# Patient Record
Sex: Male | Born: 1955 | Hispanic: Yes | Marital: Married | Smoking: Former smoker
Health system: Southern US, Community
[De-identification: ages and names within clinical notes are randomized; demographics above are authoritative.]

## PROBLEM LIST (undated history)

## (undated) DIAGNOSIS — I10 Essential (primary) hypertension: Secondary | ICD-10-CM

## (undated) DIAGNOSIS — F1021 Alcohol dependence, in remission: Secondary | ICD-10-CM

## (undated) DIAGNOSIS — N529 Male erectile dysfunction, unspecified: Secondary | ICD-10-CM

## (undated) DIAGNOSIS — E039 Hypothyroidism, unspecified: Secondary | ICD-10-CM

## (undated) DIAGNOSIS — E079 Disorder of thyroid, unspecified: Secondary | ICD-10-CM

## (undated) DIAGNOSIS — N400 Enlarged prostate without lower urinary tract symptoms: Secondary | ICD-10-CM

## (undated) DIAGNOSIS — E119 Type 2 diabetes mellitus without complications: Secondary | ICD-10-CM

## (undated) DIAGNOSIS — E785 Hyperlipidemia, unspecified: Secondary | ICD-10-CM

## (undated) DIAGNOSIS — K219 Gastro-esophageal reflux disease without esophagitis: Secondary | ICD-10-CM

## (undated) HISTORY — DX: Disorder of thyroid, unspecified: E07.9

## (undated) HISTORY — DX: Gastro-esophageal reflux disease without esophagitis: K21.9

## (undated) HISTORY — PX: ABDOMINAL SURGERY: SHX537

## (undated) HISTORY — DX: Type 2 diabetes mellitus without complications: E11.9

## (undated) HISTORY — DX: Hyperlipidemia, unspecified: E78.5

## (undated) HISTORY — DX: Essential (primary) hypertension: I10

## (undated) HISTORY — DX: Benign prostatic hyperplasia without lower urinary tract symptoms: N40.0

---

## 1996-03-23 ENCOUNTER — Inpatient Hospital Stay: Admission: TF | Admit: 1996-03-23 | Payer: Self-pay

## 1996-04-09 ENCOUNTER — Ambulatory Visit: Payer: Self-pay

## 2015-10-22 DIAGNOSIS — E119 Type 2 diabetes mellitus without complications: Secondary | ICD-10-CM | POA: Insufficient documentation

## 2015-10-22 DIAGNOSIS — I10 Essential (primary) hypertension: Secondary | ICD-10-CM | POA: Insufficient documentation

## 2016-01-07 ENCOUNTER — Other Ambulatory Visit: Payer: Self-pay | Admitting: Family Medicine

## 2016-01-07 DIAGNOSIS — R29818 Other symptoms and signs involving the nervous system: Secondary | ICD-10-CM

## 2016-01-07 DIAGNOSIS — H532 Diplopia: Secondary | ICD-10-CM

## 2016-01-08 ENCOUNTER — Ambulatory Visit
Admission: RE | Admit: 2016-01-08 | Discharge: 2016-01-08 | Disposition: A | Discharge status: Home / Self Care | Payer: BLUE CROSS/BLUE SHIELD | Source: Ambulatory Visit | Attending: Family Medicine | Admitting: Family Medicine

## 2016-01-08 DIAGNOSIS — M2548 Effusion, other site: Secondary | ICD-10-CM | POA: Diagnosis not present

## 2016-01-08 DIAGNOSIS — H532 Diplopia: Secondary | ICD-10-CM

## 2016-01-08 DIAGNOSIS — R29818 Other symptoms and signs involving the nervous system: Secondary | ICD-10-CM | POA: Diagnosis present

## 2016-01-08 DIAGNOSIS — J3489 Other specified disorders of nose and nasal sinuses: Secondary | ICD-10-CM | POA: Diagnosis not present

## 2016-01-08 LAB — POCT I-STAT CREATININE: Creatinine, Ser: 1 mg/dL (ref 0.61–1.24)

## 2016-01-08 MED ORDER — GADOBENATE DIMEGLUMINE 529 MG/ML IV SOLN
15.0000 mL | Freq: Once | INTRAVENOUS | Status: AC | PRN
  Administered 2016-01-08: 14 mL via INTRAVENOUS

## 2017-01-04 DIAGNOSIS — E782 Mixed hyperlipidemia: Secondary | ICD-10-CM | POA: Insufficient documentation

## 2017-07-14 ENCOUNTER — Emergency Department: Payer: BLUE CROSS/BLUE SHIELD

## 2017-07-14 ENCOUNTER — Encounter: Payer: Self-pay | Admitting: Emergency Medicine

## 2017-07-14 ENCOUNTER — Emergency Department
Admission: EM | Admit: 2017-07-14 | Discharge: 2017-07-14 | Disposition: A | Discharge status: Home / Self Care | Payer: BLUE CROSS/BLUE SHIELD | Attending: Emergency Medicine | Admitting: Emergency Medicine

## 2017-07-14 DIAGNOSIS — Z7984 Long term (current) use of oral hypoglycemic drugs: Secondary | ICD-10-CM | POA: Diagnosis not present

## 2017-07-14 DIAGNOSIS — R1011 Right upper quadrant pain: Secondary | ICD-10-CM | POA: Diagnosis present

## 2017-07-14 DIAGNOSIS — E119 Type 2 diabetes mellitus without complications: Secondary | ICD-10-CM | POA: Diagnosis not present

## 2017-07-14 DIAGNOSIS — Z79899 Other long term (current) drug therapy: Secondary | ICD-10-CM | POA: Insufficient documentation

## 2017-07-14 DIAGNOSIS — K8050 Calculus of bile duct without cholangitis or cholecystitis without obstruction: Secondary | ICD-10-CM

## 2017-07-14 LAB — URINALYSIS, COMPLETE (UACMP) WITH MICROSCOPIC
BACTERIA UA: NONE SEEN
BILIRUBIN URINE: NEGATIVE
Glucose, UA: >=500 mg/dL — AB
Hgb urine dipstick: NEGATIVE
Ketones, ur: 20 mg/dL — AB
LEUKOCYTES UA: NEGATIVE
NITRITE: NEGATIVE
PH: 6 (ref 5.0–8.0)
Protein, ur: NEGATIVE mg/dL
SPECIFIC GRAVITY, URINE: 1.032 — AB (ref 1.005–1.030)

## 2017-07-14 LAB — CBC
HEMATOCRIT: 42.9 % (ref 40.0–52.0)
Hemoglobin: 14.5 g/dL (ref 13.0–18.0)
MCH: 30.6 pg (ref 26.0–34.0)
MCHC: 33.7 g/dL (ref 32.0–36.0)
MCV: 90.7 fL (ref 80.0–100.0)
PLATELETS: 211 10*3/uL (ref 150–440)
RBC: 4.74 MIL/uL (ref 4.40–5.90)
RDW: 13.7 % (ref 11.5–14.5)
WBC: 10.3 10*3/uL (ref 3.8–10.6)

## 2017-07-14 LAB — COMPREHENSIVE METABOLIC PANEL
ALBUMIN: 4.6 g/dL (ref 3.5–5.0)
ALT: 29 U/L (ref 17–63)
AST: 28 U/L (ref 15–41)
Alkaline Phosphatase: 101 U/L (ref 38–126)
Anion gap: 15 (ref 5–15)
BILIRUBIN TOTAL: 1 mg/dL (ref 0.3–1.2)
BUN: 25 mg/dL — AB (ref 6–20)
CO2: 27 mmol/L (ref 22–32)
CREATININE: 1.09 mg/dL (ref 0.61–1.24)
Calcium: 9.9 mg/dL (ref 8.9–10.3)
Chloride: 98 mmol/L — ABNORMAL LOW (ref 101–111)
GFR calc Af Amer: >60 mL/min (ref >60)
GLUCOSE: 237 mg/dL — AB (ref 65–99)
Potassium: 4.3 mmol/L (ref 3.5–5.1)
Sodium: 140 mmol/L (ref 135–145)
TOTAL PROTEIN: 8.2 g/dL — AB (ref 6.5–8.1)

## 2017-07-14 LAB — LIPASE, BLOOD: Lipase: 19 U/L (ref 11–51)

## 2017-07-14 LAB — TROPONIN I

## 2017-07-14 MED ORDER — ONDANSETRON 4 MG PO TBDP: 4.0000 mg | ORAL_TABLET | Freq: Three times a day (TID) | ORAL | 0 refills | Status: DC | PRN

## 2017-07-14 MED ORDER — MORPHINE SULFATE (PF) 4 MG/ML IV SOLN
INTRAVENOUS | Status: AC
  Administered 2017-07-14: 4 mg via INTRAVENOUS
  Filled 2017-07-14: qty 1

## 2017-07-14 MED ORDER — MORPHINE SULFATE (PF) 4 MG/ML IV SOLN
4.0000 mg | Freq: Once | INTRAVENOUS | Status: AC
  Administered 2017-07-14: 4 mg via INTRAVENOUS

## 2017-07-14 MED ORDER — ONDANSETRON HCL 4 MG/2ML IJ SOLN
4.0000 mg | Freq: Once | INTRAMUSCULAR | Status: AC
  Administered 2017-07-14: 4 mg via INTRAVENOUS

## 2017-07-14 MED ORDER — ONDANSETRON HCL 4 MG/2ML IJ SOLN
INTRAMUSCULAR | Status: AC
  Administered 2017-07-14: 4 mg via INTRAVENOUS
  Filled 2017-07-14: qty 2

## 2017-07-14 MED ORDER — KETOROLAC TROMETHAMINE 10 MG PO TABS
10.0000 mg | ORAL_TABLET | Freq: Four times a day (QID) | ORAL | 0 refills | Status: DC | PRN
Start: 2017-07-14 — End: 2018-07-25

## 2017-07-14 NOTE — ED Provider Notes (Addendum)
[facility] Emergency Department Provider Note    First MD Initiated Contact with Patient 07/14/17 [phone]     (approximate)  I have reviewed the triage vital signs and the nursing notes.   HISTORY  Chief Complaint Abdominal Pain   HPI Francis Schaefer is a 61 y.o. male presents with 10 out of 10 in upper abdominal pain with onset yesterday after eating lunch. Patient states that pain has been persistent since that time and associated with vomiting. Patient denies any fever or diarrhea. Patient denies any urinary symptoms. Patient admits to a similar episode one year ago with persistent pain lasting approximately 8 hours during that occasion.   Past medical history Diabetes mellitus There are no active problems to display for this patient.   Past surgical history None  Prior to Admission medications   Medication Sig Start Date End Date Taking? Authorizing Provider  atorvastatin (LIPITOR) 40 MG tablet Take 40 mg daily by mouth.   Yes [provider]  GLIPIZIDE XL 10 MG 24 hr tablet Take 20 mg daily by mouth.   Yes [provider]  JANUVIA 100 MG tablet Take 100 mg daily by mouth.   Yes [provider]  JARDIANCE 10 MG TABS tablet Take 10 mg daily by mouth.   Yes [provider]  levothyroxine (SYNTHROID, LEVOTHROID) 50 MCG tablet Take 50 mcg daily by mouth.   Yes [provider]  lisinopril (PRINIVIL,ZESTRIL) 10 MG tablet Take 10 mg daily by mouth.   Yes [provider]  metFORMIN (GLUCOPHAGE-XR) 500 MG 24 hr tablet Take 1,000 mg 2 (two) times daily by mouth.   Yes [provider]  omeprazole (PRILOSEC) 20 MG capsule Take 20 mg daily by mouth.   Yes [provider]  pioglitazone (ACTOS) 45 MG tablet Take 45 mg daily by mouth.   Yes [provider]  tamsulosin (FLOMAX) 0.4 MG CAPS capsule Take 0.4 mg daily by mouth.   Yes [provider]    Allergies No known drug  allergies History reviewed. No pertinent family history.  Social History Social History   Tobacco Use  . Smoking status: Never Smoker  . Smokeless tobacco: Never Used  Substance Use Topics  . Alcohol use: No    Frequency: Never  . Drug use: No    Review of Systems Constitutional: No fever/chills Eyes: No visual changes. ENT: No sore throat. Cardiovascular: Denies chest pain. Respiratory: Denies shortness of breath. Gastrointestinal: Positive abdominal pain and vomiting, no constipation or diarrhea Genitourinary: Negative for dysuria. Musculoskeletal: Negative for neck pain.  Negative for back pain. Integumentary: Negative for rash. Neurological: Negative for headaches, focal weakness or numbness.   ____________________________________________   PHYSICAL EXAM:  VITAL SIGNS: ED Triage Vitals  Enc Vitals Group     BP 07/14/17 0212 128/86     Pulse Rate 07/14/17 0212 74     Resp 07/14/17 0212 16     Temp 07/14/17 0212 97.6 F (36.4 C)     Temp Source 07/14/17 0212 Oral     SpO2 07/14/17 0212 98 %     Weight 07/14/17 0209 72.6 kg (160 lb)     Height --      Head Circumference --      Peak Flow --      Pain Score --      Pain Loc --      Pain Edu? --      Excl. in GC? --  Constitutional: Alert and oriented. Apparent discomfort Eyes: Conjunctivae are normal. Head: Atraumatic. Mouth/Throat: Mucous membranes are moist.  Oropharynx non-erythematous. Neck: No stridor.   Cardiovascular: Normal rate, regular rhythm. Good peripheral circulation. Grossly normal heart sounds. Respiratory: Normal respiratory effort.  No retractions. Lungs CTAB. Gastrointestinal: Right upper quadrant tenderness to palpation. No distention.  Musculoskeletal: No lower extremity tenderness nor edema. No gross deformities of extremities. Neurologic:  Normal speech and language. No gross focal neurologic deficits are appreciated.  Skin:  Skin is warm, dry and intact. No rash  noted. Psychiatric: Mood and affect are normal. Speech and behavior are normal.  ____________________________________________   LABS (all labs ordered are listed, but only abnormal results are displayed)  Labs Reviewed  COMPREHENSIVE METABOLIC PANEL - Abnormal; Notable for the following components:      Result Value   Chloride 98 (*)    Glucose, Bld 237 (*)    BUN 25 (*)    Total Protein 8.2 (*)    All other components within normal limits  URINALYSIS, COMPLETE (UACMP) WITH MICROSCOPIC - Abnormal; Notable for the following components:   Color, Urine YELLOW (*)    APPearance CLEAR (*)    Specific Gravity, Urine 1.032 (*)    Glucose, UA >=500 (*)    Ketones, ur 20 (*)    Squamous Epithelial / LPF 0-5 (*)    All other components within normal limits  LIPASE, BLOOD  CBC  TROPONIN I   ____________________________________________  EKG  ED ECG REPORT I, Francis Schaefer, the attending physician, personally viewed and interpreted this ECG.   Date: 07/14/2017  EKG Time: 2:13 AM  Rate: 68  Rhythm: Normal sinus rhythm  Axis: Normal  Intervals: Normal  ST&T Change: None  __________________________________ Procedures   ____________________________________________   INITIAL IMPRESSION / ASSESSMENT AND PLAN / ED COURSE  As part of my medical decision making, I reviewed the following data within the electronic MEDICAL RECORD NUMBER 61 year old male presenting with above stated history and physical exam concern for possible cholelithiasis versus cholecystitis and a such ultrasound performed. Patient given IV morphine 4 mg Zofran 4 mg a pain improvement. Patient's care transferred to Dr. Scotty Schaefer disposition dependent upon ultrasound findings.  Clinical Course as of Jul 14 1304  Thu Jul 14, 2017  [id number] Koreas neg for cholecystitis. Pain controlled. VSS. Wbc nl. Will po trial, plan discharge if sx controlled.   [PS]  1115 Tolerating oral intake. Suitable for outpt f/u with surg clinic.    [PS]    Clinical Course User Index [PS] Sharman Schaefer, Phillip, MD    ____________________________________________  FINAL CLINICAL IMPRESSION(S) / ED DIAGNOSES  Final diagnoses:  RUQ pain  Right upper quadrant abdominal pain     MEDICATIONS GIVEN DURING THIS VISIT:  Medications  ondansetron (ZOFRAN) 4 MG/2ML injection (not administered)  morphine 4 MG/ML injection (not administered)  morphine 4 MG/ML injection 4 mg (not administered)  ondansetron (ZOFRAN) injection 4 mg (not administered)     ED Discharge Orders    None       Note:  This document was prepared using Dragon voice recognition software and may include unintentional dictation errors.    Darci CurrentBrown, [place] N, MD 07/14/17 [phone]    Darci CurrentBrown, Malone N, MD 07/14/17 [phone]

## 2017-07-14 NOTE — ED Triage Notes (Signed)
Pt c/o upper abdominal pain that started after pt had dinner on Wednesday. Pt denies N/V/D. Pt to ED this AM due to the pain not going away. Pt denies medical HX and denies taking medication at home for relief.

## 2017-07-14 NOTE — Discharge Instructions (Signed)
Your ultrasound shows gallstones but no signs of gallbladder infection. Follow up with surgery for further evaluation of your symptoms.

## 2017-07-14 NOTE — ED Notes (Signed)
Pt provided juice and graham crackers for PO/fluid challenge.

## 2017-07-14 NOTE — ED Provider Notes (Signed)
Clinical Course as of Jul 14 1116  Thu Jul 14, 2017  [id number] Koreas neg for cholecystitis. Pain controlled. VSS. Wbc nl. Will po trial, plan discharge if sx controlled.   [PS]  1115 Tolerating oral intake. Suitable for outpt f/u with surg clinic.   [PS]    Clinical Course User Index [PS] Sharman CheekStafford, Shanekia Latella, MD   Final diagnoses:  RUQ pain  Right upper quadrant abdominal pain  Biliary colic      Sharman CheekStafford, Alija Riano, MD 07/14/17 1117

## 2017-07-14 NOTE — ED Notes (Signed)
Patient transported to Ultrasound 

## 2017-07-14 NOTE — ED Notes (Addendum)
Pt states intense pain to right upper quadrant following eating pork for lunch approx 11 oclock yesterday. Pt states persistent pain. Tender upon palpation

## 2017-07-20 DIAGNOSIS — N400 Enlarged prostate without lower urinary tract symptoms: Secondary | ICD-10-CM | POA: Insufficient documentation

## 2017-07-21 ENCOUNTER — Encounter: Payer: Self-pay | Admitting: Surgery

## 2017-07-21 ENCOUNTER — Ambulatory Visit (INDEPENDENT_AMBULATORY_CARE_PROVIDER_SITE_OTHER): Payer: BLUE CROSS/BLUE SHIELD | Admitting: Surgery

## 2017-07-21 VITALS — BP 144/75 | HR 74 | Temp 98.1°F | Ht 63.0 in | Wt 159.0 lb

## 2017-07-21 DIAGNOSIS — K8020 Calculus of gallbladder without cholecystitis without obstruction: Secondary | ICD-10-CM
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
## 2017-07-21 NOTE — Patient Instructions (Addendum)
You have requested to have your Gallbladder removed. We will arrange this to be done on 08/16/2017 at [facility] by Dr. Dionne Miloichard Cooper.   You will be off from work for approximately 1-2 weeks depending on your recovery.   Please avoid greasy and fried foods if at all possible prior to your scheduled surgery to decrease symptoms until then.  Please see the [facility] pre-care form you have been given today.  If you have any questions or concerns please call our office.     Colecistectoma abierta, cuidados posteriores (Open Cholecystectomy, Care After) Lea esta informacin sobre cmo cuidarse despus del procedimiento. El mdico tambin podr darle instrucciones ms especficas. Comunquese con el mdico si tiene problemas o preguntas. QU ESPERAR DESPUS DEL PROCEDIMIENTO Despus del procedimiento, es comn tener los siguientes sntomas:  Art therapistDolor en el lugar de la incisin. Le darn analgsicos para Human resources officercontrolar el dolor.  Nuseas o vmitos leves. INSTRUCCIONES PARA EL CUIDADO EN EL HOGAR Cuidados de la incisin  Siga las indicaciones del mdico acerca del cuidado de la incisin. Haga lo siguiente: ? Lvese las manos con agua y jabn antes de Multimedia programmercambiar las vendas (vendaje). Use desinfectante para manos si no dispone de [place] y [place]n. ? Cambie el vendaje como se lo haya indicado el mdico. ? No retire los puntos (suturas), el QUALCOMMadhesivo para la piel o las tiras [place] Creekadhesivas. Es posible que estos deban quedar puestos en la piel durante 2semanas o ms tiempo. Si los bordes de las tiras [address]adhesivas empiezan a despegarse y Scientific laboratory technicianenroscarse, puede recortar los que estn sueltos. No retire las tiras Agilent Technologiesadhesivas por completo a menos que el mdico se lo indique.  No tome baos de inmersin, no nade ni use el jacuzzi hasta que el mdico lo autorice. Pregntele al mdico si puede ducharse. Delle Reiningal vez solo le permitan tomar baos de [place]esponja.  Controle todos los das la zona de la incisin para detectar signos de  infeccin. Est atento a los siguientes signos: ? Aumento del enrojecimiento, de la hinchazn o del dolor. ? Ms lquido [place] Janskyo sangre. ? Calor. ? Pus o mal olor. Actividad  No conduzca ni use maquinaria pesada mientras toma analgsicos recetados.  No levante ningn objeto que pese ms de 10libras (4,5kg) hasta que el mdico lo autorice.  No practique deportes de contacto hasta que el mdico lo autorice.  No conduzca durante 24horas si le dieron un medicamento para ayudarlo a que se relaje (sedante).  Descanse todo lo que sea necesario.No regrese al Aleen Campitrabajo o a la escuela hasta que el mdico lo autorice. Instrucciones generales  Baxter Internationalome los medicamentos de venta libre y los recetados solamente como se lo haya indicado el mdico.  A fin de prevenir o tratar el estreimiento mientras toma analgsicos recetados, el mdico puede recomendarle lo siguiente: ? Beba suficiente lquido para mantener la orina clara o de color amarillo plido. ? Tomar medicamentos recetados o de H. J. Heinzventa libre. ? Consumir alimentos ricos en fibra, como frutas y verduras frescas, cereales integrales y frijoles. ? Limitar el consumo de alimentos con alto contenido de grasas y azcares procesados, como alimentos fritos o dulces. SOLICITE ATENCIN MDICA SI:  Le aparece una erupcin cutnea.  Aumentan el enrojecimiento, la hinchazn o el dolor alrededor de la incisin.  Le sale ms lquido o sangre de la incisin.  La incisin est caliente al tacto.  Tiene pus o percibe que sale mal olor del lugar de la incisin.  Tiene fiebre.  La herida se abre. SOLICITE ATENCIN MDICA DE Lanney GinsINMEDIATO  SI:  Tiene dificultad para respirar.  Siente dolor en el pecho.  Siente ms dolor en los hombros.  Se desmaya o se siente mareado al ponerse de pie.  Siente un dolor intenso en el abdomen.  Tiene nuseas o vmitos durante ms de Civil engineer, contractingun da.  Siente dolor en la pierna. Esta informacin no tiene Theme park managercomo fin reemplazar el consejo  del mdico. Asegrese de hacerle al mdico cualquier pregunta que tenga. Document Released: 11/30/2007 Document Revised: 06/13/2013 Document Reviewed: 02/09/2016 Elsevier Interactive Patient Education  2018 [employer]vier Inc.  Colecistectoma laparoscpica, cuidados posteriores (Laparoscopic Cholecystectomy, Care After) Estas indicaciones le proporcionan informacin acerca de cmo deber cuidarse despus del procedimiento. El mdico tambin podr darle instrucciones especficas. Comunquese con el mdico si tiene algn problema o tiene preguntas despus del procedimiento. CUIDADOS EN EL HOGAR Cuidado de la incisin  Siga las indicaciones del mdico en lo que respecta al cuidado de los cortes de la ciruga (incisiones). Haga lo siguiente: ? Lvese las manos con agua y jabn antes de Multimedia programmercambiar las vendas (vendaje). Use un desinfectante para manos si no dispone de [place] y [place]jabn. ? Cambie el vendaje como se lo haya indicado el mdico. ? No retire los puntos (suturas), el QUALCOMMadhesivo para la piel o las tiras [place]adhesivas. Tal vez deban dejarse puestos en la piel durante 2semanas o ms tiempo. Si las tiras [place]adhesivas se despegan y se enroscan, puede recortar los bordes sueltos. No retire las tiras Agilent Technologiesadhesivas por completo a menos que el mdico lo autorice.  No tome baos de inmersin, no practique natacin ni use el jacuzzi hasta que el mdico lo autorice. Pregntele al mdico si puede ducharse. Delle Reiningal vez solo le permitan darse baos de [place]eesponja. Instrucciones generales  Baxter Internationalome los medicamentos de venta libre y los recetados solamente como se lo haya indicado el mdico.  No conduzca ni use maquinaria pesada mientras toma analgsicos recetados.  Reanude la dieta normal como se lo haya indicado el mdico.  No levante ningn objeto que pese ms de 10libras (4,5kg).  No practique deportes de contacto durante 1semana o hasta que el mdico lo autorice. SOLICITE AYUDA SI:  Tiene enrojecimiento, hinchazn o Designer, fashion/clothingdolor en  el lugar de los cortes quirrgicos.  Tiene secrecin de lquido, sangre o pus que Micron Technologyemana de los cortes.  Percibe que sale mal olor de la zona de las incisiones.  Los cortes quirrgicos se abren.  Tiene fiebre.  SOLICITE AYUDA DE INMEDIATO SI:  Tiene una erupcin cutnea.  Tiene dificultad para respirar.  Siente dolor en el pecho.  Siente que Chief Technology Officerel dolor en los hombros (en la zona donde van los breteles) Harrington Challengerempeora.  Se desvanece (se desmaya) o se marea mientras est de pie.  Tiene dolor muy intenso de vientre (abdomen).  Tiene malestar estomacal (nuseas) o vomita durante ms de 1da.  Esta informacin no tiene Theme park managercomo fin reemplazar el consejo del mdico. Asegrese de hacerle al mdico cualquier pregunta que tenga. Document Released: 05/05/2011 Document Revised: 05/14/2015 Document Reviewed: 02/09/2016 Elsevier Interactive Patient Education  2017 ArvinMer[employer]Elsevier Inc.

## 2017-07-25 ENCOUNTER — Telehealth: Payer: Self-pay | Admitting: Surgery

## 2017-07-25 NOTE — Telephone Encounter (Signed)
I have called patient to go over surgery information below.    pre op date/time and sx date. Sx: 08/16/17 with Dr Ludwig Clarksooper--laparoscopic cholecystectomy.  Pre op: 08/09/17 between 9-1:00pm--phone interview.   Patient made aware to call [phone], between 1-3:00pm the day before surgery, to find out what time to arrive.    Physician estimate is 559.60.

## 2017-07-26 NOTE — Telephone Encounter (Signed)
Patient has called and was informed of all surgery information.

## 2017-08-09 ENCOUNTER — Other Ambulatory Visit: Payer: Self-pay

## 2017-08-09 ENCOUNTER — Encounter
Admission: RE | Admit: 2017-08-09 | Discharge: 2017-08-09 | Disposition: A | Discharge status: Home / Self Care | Payer: BLUE CROSS/BLUE SHIELD | Source: Ambulatory Visit | Attending: Surgery | Admitting: Surgery

## 2017-08-09 NOTE — Patient Instructions (Signed)
  Your procedure is scheduled on: 08-16-17 TUESDAY Report to Same Day Surgery 2nd floor medical mall [facility](Medical Mall Entrance-take elevator on left to 2nd floor.  Check in with surgery information desk.) To find out your arrival time please call [phone] between 1PM - 3PM on 08-15-17 MONDAY  Remember: Instructions that are not followed completely may result in serious medical risk, up to and including death, or upon the discretion of your surgeon and anesthesiologist your surgery may need to be rescheduled.    _x___ 1. Do not eat food after midnight the night before your procedure. NO GUM OR CANDY AFTER MIDNIGHT.  You may drink WATER up to 2 hours before you are scheduled to arrive at the hospital for your procedure.  Do not drink WATER within 2 hours of your scheduled arrival to the hospital.  Type 1 and type 2 diabetics should only drink water.    __x__ 2. No Alcohol for 24 hours before or after surgery.   __x__3. No Smoking for 24 prior to surgery.   ____  4. Bring all medications with you on the day of surgery if instructed.    __x__ 5. Notify your doctor if there is any change in your medical condition     (cold, fever, infections).     Do not wear jewelry, make-up, hairpins, clips or nail polish.  Do not wear lotions, powders, or perfumes. You may wear deodorant.  Do not shave 48 hours prior to surgery. Men may shave face and neck.  Do not bring valuables to the hospital.    [facility] for any belongings or valuables.               Contacts, dentures or bridgework may not be worn into surgery.  Leave your suitcase in the car. After surgery it may be brought to your room.  For patients admitted to the hospital, discharge time is determined by your treatment team.   Patients discharged the day of surgery will not be allowed to drive home.  You will need someone to drive you home and stay with you the night of your procedure.    Please read over the  following fact sheets that you were given:   [facility] Preparing for Surgery and or MRSA Information   _x___ TAKE THE FOLLOWING MEDICATIONS THE MORNING OF SURGERY WITH A SMALL SIP OF WATER. These include:  1. LIPITOR (ATORVASTATIN)  2. FLOMAX (TAMSULOSIN)  3. PRILOSEC (OMEPRAZOLE)  4. TAKE AN EXTRA PRILOSEC Monday NIGHT BEFORE BED  5.  6.  ____Fleets enema or Magnesium Citrate as directed.   _x___ Use CHG Soap or sage wipes as directed on instruction sheet   ____ Use inhalers on the day of surgery and bring to hospital day of surgery  _X___ Stop Metformin and Janumet 2 days prior to surgery-LAST DOSE OF METFORMIN ON Saturday, December 8TH  ____ Take 1/2 of usual insulin dose the night before surgery and none on the morning surgery.   ____ Follow recommendations from Cardiologist, Pulmonologist or PCP regarding  stopping Aspirin, Coumadin, Plavix ,Eliquis, Effient, or Pradaxa, and Pletal.  X____Stop Anti-inflammatories such as Advil, Aleve, Ibuprofen, Motrin, Naproxen, Naprosyn, Goodies powders or aspirin products NOW-OK to take Tylenol    ____ Stop supplements until after surgery.     ____ Bring C-Pap to the hospital.

## 2017-08-11 ENCOUNTER — Encounter
Admission: RE | Admit: 2017-08-11 | Discharge: 2017-08-11 | Disposition: A | Discharge status: Home / Self Care | Payer: BLUE CROSS/BLUE SHIELD | Source: Ambulatory Visit | Attending: Surgery | Admitting: Surgery

## 2017-08-11 DIAGNOSIS — K8020 Calculus of gallbladder without cholecystitis without obstruction: Secondary | ICD-10-CM | POA: Insufficient documentation

## 2017-08-11 LAB — CBC WITH DIFFERENTIAL/PLATELET
BASOS ABS: 0 10*3/uL (ref 0–0.1)
Basophils Relative: 1 %
Eosinophils Absolute: 0.2 10*3/uL (ref 0–0.7)
Eosinophils Relative: 2 %
HEMATOCRIT: 38.3 % — AB (ref 40.0–52.0)
HEMOGLOBIN: 13.1 g/dL (ref 13.0–18.0)
LYMPHS PCT: 16 %
Lymphs Abs: 1.5 10*3/uL (ref 1.0–3.6)
MCH: 30.6 pg (ref 26.0–34.0)
MCHC: 34.2 g/dL (ref 32.0–36.0)
MCV: 89.4 fL (ref 80.0–100.0)
MONO ABS: 0.7 10*3/uL (ref 0.2–1.0)
MONOS PCT: 8 %
NEUTROS ABS: 6.7 10*3/uL — AB (ref 1.4–6.5)
NEUTROS PCT: 73 %
Platelets: 188 10*3/uL (ref 150–440)
RBC: 4.28 MIL/uL — ABNORMAL LOW (ref 4.40–5.90)
RDW: 13.7 % (ref 11.5–14.5)
WBC: 9.2 10*3/uL (ref 3.8–10.6)

## 2017-08-11 LAB — COMPREHENSIVE METABOLIC PANEL
ALBUMIN: 4 g/dL (ref 3.5–5.0)
ALK PHOS: 101 U/L (ref 38–126)
ALT: 28 U/L (ref 17–63)
AST: 21 U/L (ref 15–41)
Anion gap: 8 (ref 5–15)
BILIRUBIN TOTAL: 0.6 mg/dL (ref 0.3–1.2)
BUN: 22 mg/dL — AB (ref 6–20)
CALCIUM: 9 mg/dL (ref 8.9–10.3)
CO2: 24 mmol/L (ref 22–32)
CREATININE: 1.08 mg/dL (ref 0.61–1.24)
Chloride: 107 mmol/L (ref 101–111)
GFR calc Af Amer: >60 mL/min (ref >60)
GLUCOSE: 140 mg/dL — AB (ref 65–99)
POTASSIUM: 4.3 mmol/L (ref 3.5–5.1)
Sodium: 139 mmol/L (ref 135–145)
TOTAL PROTEIN: 7.3 g/dL (ref 6.5–8.1)

## 2017-08-15 MED ORDER — CEFAZOLIN SODIUM-DEXTROSE 2-4 GM/100ML-% IV SOLN
2.0000 g | INTRAVENOUS | Status: AC
  Administered 2017-08-16: 2 g via INTRAVENOUS

## 2017-08-16 ENCOUNTER — Ambulatory Visit: Payer: BLUE CROSS/BLUE SHIELD | Admitting: Certified Registered"

## 2017-08-16 ENCOUNTER — Ambulatory Visit
Admission: RE | Admit: 2017-08-16 | Discharge: 2017-08-16 | Disposition: A | Discharge status: Home / Self Care | Payer: BLUE CROSS/BLUE SHIELD | Source: Ambulatory Visit | Attending: Surgery | Admitting: Surgery

## 2017-08-16 ENCOUNTER — Encounter
Admission: RE | Disposition: A | Discharge status: Home / Self Care | Payer: Self-pay | Source: Ambulatory Visit | Attending: Surgery

## 2017-08-16 ENCOUNTER — Encounter: Payer: Self-pay | Admitting: *Deleted

## 2017-08-16 ENCOUNTER — Other Ambulatory Visit: Payer: Self-pay

## 2017-08-16 DIAGNOSIS — E119 Type 2 diabetes mellitus without complications: Secondary | ICD-10-CM | POA: Diagnosis not present

## 2017-08-16 DIAGNOSIS — N400 Enlarged prostate without lower urinary tract symptoms: Secondary | ICD-10-CM | POA: Diagnosis not present

## 2017-08-16 DIAGNOSIS — K8050 Calculus of bile duct without cholangitis or cholecystitis without obstruction: Secondary | ICD-10-CM | POA: Diagnosis not present

## 2017-08-16 DIAGNOSIS — K219 Gastro-esophageal reflux disease without esophagitis: Secondary | ICD-10-CM | POA: Diagnosis not present

## 2017-08-16 DIAGNOSIS — Z7984 Long term (current) use of oral hypoglycemic drugs: Secondary | ICD-10-CM | POA: Insufficient documentation

## 2017-08-16 DIAGNOSIS — E785 Hyperlipidemia, unspecified: Secondary | ICD-10-CM | POA: Insufficient documentation

## 2017-08-16 DIAGNOSIS — I10 Essential (primary) hypertension: Secondary | ICD-10-CM | POA: Insufficient documentation

## 2017-08-16 DIAGNOSIS — K8000 Calculus of gallbladder with acute cholecystitis without obstruction: Secondary | ICD-10-CM | POA: Diagnosis not present

## 2017-08-16 DIAGNOSIS — E039 Hypothyroidism, unspecified: Secondary | ICD-10-CM | POA: Diagnosis not present

## 2017-08-16 DIAGNOSIS — Z79899 Other long term (current) drug therapy: Secondary | ICD-10-CM | POA: Diagnosis not present

## 2017-08-16 DIAGNOSIS — Z87891 Personal history of nicotine dependence: Secondary | ICD-10-CM | POA: Insufficient documentation

## 2017-08-16 HISTORY — PX: CHOLECYSTECTOMY: SHX55

## 2017-08-16 LAB — GLUCOSE, CAPILLARY
GLUCOSE-CAPILLARY: 238 mg/dL — AB (ref 65–99)
Glucose-Capillary: 246 mg/dL — ABNORMAL HIGH (ref 65–99)

## 2017-08-16 SURGERY — LAPAROSCOPIC CHOLECYSTECTOMY
Anesthesia: General | Wound class: Clean Contaminated

## 2017-08-16 MED ORDER — ACETAMINOPHEN 10 MG/ML IV SOLN
INTRAVENOUS | Status: DC | PRN
  Administered 2017-08-16: 1000 mg via INTRAVENOUS

## 2017-08-16 MED ORDER — PHENYLEPHRINE HCL 10 MG/ML IJ SOLN
INTRAMUSCULAR | Status: DC | PRN
  Administered 2017-08-16: 100 ug via INTRAVENOUS
  Administered 2017-08-16: 200 ug via INTRAVENOUS

## 2017-08-16 MED ORDER — SUGAMMADEX SODIUM 200 MG/2ML IV SOLN
INTRAVENOUS | Status: DC | PRN
  Administered 2017-08-16: 150 mg via INTRAVENOUS

## 2017-08-16 MED ORDER — LIDOCAINE HCL (PF) 2 % IJ SOLN
INTRAMUSCULAR | Status: AC
  Filled 2017-08-16: qty 10

## 2017-08-16 MED ORDER — ROCURONIUM BROMIDE 100 MG/10ML IV SOLN
INTRAVENOUS | Status: DC | PRN
  Administered 2017-08-16: 40 mg via INTRAVENOUS
  Administered 2017-08-16: 5 mg via INTRAVENOUS

## 2017-08-16 MED ORDER — ROCURONIUM BROMIDE 50 MG/5ML IV SOLN
INTRAVENOUS | Status: AC
  Filled 2017-08-16: qty 1

## 2017-08-16 MED ORDER — MIDAZOLAM HCL 2 MG/2ML IJ SOLN
INTRAMUSCULAR | Status: AC
  Filled 2017-08-16: qty 2

## 2017-08-16 MED ORDER — FENTANYL CITRATE (PF) 100 MCG/2ML IJ SOLN
25.0000 ug | INTRAMUSCULAR | Status: DC | PRN
  Administered 2017-08-16: 50 ug via INTRAVENOUS

## 2017-08-16 MED ORDER — FENTANYL CITRATE (PF) 100 MCG/2ML IJ SOLN
INTRAMUSCULAR | Status: AC
Start: 2017-08-16 — End: ?
  Filled 2017-08-16: qty 2

## 2017-08-16 MED ORDER — KETOROLAC TROMETHAMINE 30 MG/ML IJ SOLN
INTRAMUSCULAR | Status: AC
  Filled 2017-08-16: qty 1

## 2017-08-16 MED ORDER — CEFAZOLIN SODIUM-DEXTROSE 2-4 GM/100ML-% IV SOLN
INTRAVENOUS | Status: AC
  Filled 2017-08-16: qty 100

## 2017-08-16 MED ORDER — BUPIVACAINE-EPINEPHRINE (PF) 0.25% -1:200000 IJ SOLN
INTRAMUSCULAR | Status: AC
  Filled 2017-08-16: qty 30

## 2017-08-16 MED ORDER — HEPARIN SODIUM (PORCINE) 5000 UNIT/ML IJ SOLN
5000.0000 [IU] | Freq: Once | INTRAMUSCULAR | Status: AC
  Administered 2017-08-16: 5000 [IU] via SUBCUTANEOUS

## 2017-08-16 MED ORDER — PROPOFOL 10 MG/ML IV BOLUS
INTRAVENOUS | Status: AC
  Filled 2017-08-16: qty 20

## 2017-08-16 MED ORDER — SUGAMMADEX SODIUM 200 MG/2ML IV SOLN
INTRAVENOUS | Status: AC
  Filled 2017-08-16: qty 2

## 2017-08-16 MED ORDER — ONDANSETRON HCL 4 MG/2ML IJ SOLN
INTRAMUSCULAR | Status: DC | PRN
  Administered 2017-08-16: 4 mg via INTRAVENOUS

## 2017-08-16 MED ORDER — PROMETHAZINE HCL 25 MG/ML IJ SOLN: 6.2500 mg | INTRAMUSCULAR | Status: DC | PRN

## 2017-08-16 MED ORDER — SUCCINYLCHOLINE CHLORIDE 20 MG/ML IJ SOLN
INTRAMUSCULAR | Status: DC | PRN
  Administered 2017-08-16: 100 mg via INTRAVENOUS

## 2017-08-16 MED ORDER — PROPOFOL 10 MG/ML IV BOLUS
INTRAVENOUS | Status: DC | PRN
  Administered 2017-08-16: 150 mg via INTRAVENOUS

## 2017-08-16 MED ORDER — SUCCINYLCHOLINE CHLORIDE 20 MG/ML IJ SOLN
INTRAMUSCULAR | Status: AC
  Filled 2017-08-16: qty 1

## 2017-08-16 MED ORDER — FENTANYL CITRATE (PF) 100 MCG/2ML IJ SOLN
INTRAMUSCULAR | Status: AC
  Filled 2017-08-16: qty 2

## 2017-08-16 MED ORDER — BUPIVACAINE-EPINEPHRINE (PF) 0.25% -1:200000 IJ SOLN
INTRAMUSCULAR | Status: DC | PRN
  Administered 2017-08-16: 30 mL

## 2017-08-16 MED ORDER — KETOROLAC TROMETHAMINE 30 MG/ML IJ SOLN
INTRAMUSCULAR | Status: DC | PRN
  Administered 2017-08-16: 30 mg via INTRAVENOUS

## 2017-08-16 MED ORDER — CHLORHEXIDINE GLUCONATE CLOTH 2 % EX PADS: 6.0000 | MEDICATED_PAD | Freq: Once | CUTANEOUS | Status: DC

## 2017-08-16 MED ORDER — FENTANYL CITRATE (PF) 100 MCG/2ML IJ SOLN
INTRAMUSCULAR | Status: DC | PRN
  Administered 2017-08-16: 100 ug via INTRAVENOUS

## 2017-08-16 MED ORDER — PHENYLEPHRINE HCL 10 MG/ML IJ SOLN
INTRAMUSCULAR | Status: AC
  Filled 2017-08-16: qty 1

## 2017-08-16 MED ORDER — SODIUM CHLORIDE 0.9 % IV SOLN
INTRAVENOUS | Status: DC
  Administered 2017-08-16: 09:00:00 via INTRAVENOUS

## 2017-08-16 MED ORDER — ACETAMINOPHEN 10 MG/ML IV SOLN
INTRAVENOUS | Status: AC
  Filled 2017-08-16: qty 100

## 2017-08-16 MED ORDER — ONDANSETRON HCL 4 MG/2ML IJ SOLN
INTRAMUSCULAR | Status: AC
  Filled 2017-08-16: qty 2

## 2017-08-16 MED ORDER — HYDROCODONE-ACETAMINOPHEN 5-300 MG PO TABS: 1.0000 | ORAL_TABLET | ORAL | 0 refills | Status: DC | PRN

## 2017-08-16 MED ORDER — LIDOCAINE HCL (CARDIAC) 20 MG/ML IV SOLN
INTRAVENOUS | Status: DC | PRN
  Administered 2017-08-16: 80 mg via INTRAVENOUS

## 2017-08-16 MED ORDER — MIDAZOLAM HCL 2 MG/2ML IJ SOLN
INTRAMUSCULAR | Status: DC | PRN
  Administered 2017-08-16: 2 mg via INTRAVENOUS

## 2017-08-16 MED ORDER — HEPARIN SODIUM (PORCINE) 5000 UNIT/ML IJ SOLN
INTRAMUSCULAR | Status: AC
  Administered 2017-08-16: 5000 [IU] via SUBCUTANEOUS
  Filled 2017-08-16: qty 1

## 2017-08-16 SURGICAL SUPPLY — 43 items
ADHESIVE MASTISOL STRL (MISCELLANEOUS) ×3 IMPLANT
APPLIER CLIP ROT 10 11.4 M/L (STAPLE) ×3
BLADE SURG SZ11 CARB STEEL (BLADE) ×3 IMPLANT
CANISTER SUCT 1200ML W/VALVE (MISCELLANEOUS) ×3 IMPLANT
CATH CHOLANGI 4FR 420404F (CATHETERS) IMPLANT
CHLORAPREP W/TINT 26ML (MISCELLANEOUS) ×3 IMPLANT
CLIP APPLIE ROT 10 11.4 M/L (STAPLE) ×1 IMPLANT
CLOSURE WOUND 1/2 X4 (GAUZE/BANDAGES/DRESSINGS) ×1
CONRAY 60ML FOR OR (MISCELLANEOUS) IMPLANT
DRAPE C-ARM XRAY 36X54 (DRAPES) IMPLANT
ELECT REM PT RETURN 9FT ADLT (ELECTROSURGICAL) ×3
ELECTRODE REM PT RTRN 9FT ADLT (ELECTROSURGICAL) ×1 IMPLANT
GAUZE SPONGE NON-WVN 2X2 STRL (MISCELLANEOUS) ×4 IMPLANT
GLOVE BIO SURGEON STRL SZ8 (GLOVE) ×3 IMPLANT
GOWN STRL REUS W/ TWL LRG LVL3 (GOWN DISPOSABLE) ×4 IMPLANT
GOWN STRL REUS W/TWL LRG LVL3 (GOWN DISPOSABLE) ×8
IRRIGATION STRYKERFLOW (MISCELLANEOUS) ×1 IMPLANT
IRRIGATOR STRYKERFLOW (MISCELLANEOUS) ×3
IV CATH ANGIO 12GX3 LT BLUE (NEEDLE) IMPLANT
IV NS 1000ML (IV SOLUTION) ×2
IV NS 1000ML BAXH (IV SOLUTION) ×1 IMPLANT
JACKSON PRATT 10 (INSTRUMENTS) IMPLANT
KIT RM TURNOVER STRD PROC AR (KITS) ×3 IMPLANT
LABEL OR SOLS (LABEL) ×3 IMPLANT
NEEDLE HYPO 22GX1.5 SAFETY (NEEDLE) ×3 IMPLANT
NEEDLE VERESS 14GA 120MM (NEEDLE) ×3 IMPLANT
NS IRRIG 500ML POUR BTL (IV SOLUTION) ×3 IMPLANT
PACK LAP CHOLECYSTECTOMY (MISCELLANEOUS) ×3 IMPLANT
POUCH SPECIMEN RETRIEVAL 10MM (ENDOMECHANICALS) ×3 IMPLANT
SCISSORS METZENBAUM CVD 33 (INSTRUMENTS) ×3 IMPLANT
SLEEVE ENDOPATH XCEL 5M (ENDOMECHANICALS) ×6 IMPLANT
SPONGE LAP 18X18 5 PK (GAUZE/BANDAGES/DRESSINGS) ×3 IMPLANT
SPONGE VERSALON 2X2 STRL (MISCELLANEOUS) ×8
SPONGE VERSALON 4X4 4PLY (MISCELLANEOUS) IMPLANT
STRIP CLOSURE SKIN 1/2X4 (GAUZE/BANDAGES/DRESSINGS) ×2 IMPLANT
SUT MNCRL 4-0 (SUTURE) ×2
SUT MNCRL 4-0 27XMFL (SUTURE) ×1
SUT VICRYL 0 AB UR-6 (SUTURE) ×3 IMPLANT
SUTURE MNCRL 4-0 27XMF (SUTURE) ×1 IMPLANT
SYR 20CC LL (SYRINGE) ×3 IMPLANT
TROCAR XCEL NON-BLD 11X100MML (ENDOMECHANICALS) ×3 IMPLANT
TROCAR XCEL NON-BLD 5MMX100MML (ENDOMECHANICALS) ×3 IMPLANT
TUBING INSUFFLATION (TUBING) ×3 IMPLANT

## 2017-08-16 NOTE — Anesthesia Post-op Follow-up Note (Signed)
Anesthesia QCDR form completed.        

## 2017-08-16 NOTE — Anesthesia Preprocedure Evaluation (Signed)
Anesthesia Evaluation  Patient identified by MRN, date of birth, ID band Patient awake    Reviewed: Allergy & Precautions, H&P , NPO status , Patient's Chart, lab work & pertinent test results, reviewed documented beta blocker date and time   History of Anesthesia Complications Negative for: history of anesthetic complications  Airway Mallampati: II  TM Distance: >3 FB Neck ROM: full    Dental  (+) Poor Dentition, Missing, Dental Advidsory Given   Pulmonary neg pulmonary ROS, former smoker,           Cardiovascular Exercise Tolerance: Good hypertension, (-) angina(-) CAD, (-) Past MI, (-) Cardiac Stents and (-) CABG (-) dysrhythmias (-) Valvular Problems/Murmurs     Neuro/Psych negative neurological ROS  negative psych ROS   GI/Hepatic Neg liver ROS, GERD  ,  Endo/Other  diabetesHypothyroidism   Renal/GU negative Renal ROS  negative genitourinary   Musculoskeletal   Abdominal   Peds  Hematology negative hematology ROS (+)   Anesthesia Other Findings Past Medical History: No date: Diabetes mellitus without complication (HCC) No date: Enlarged prostate No date: GERD (gastroesophageal reflux disease) No date: Hyperlipidemia No date: Hypertension No date: Thyroid disease   Reproductive/Obstetrics negative OB ROS                             Anesthesia Physical Anesthesia Plan  ASA: II  Anesthesia Plan: General   Post-op Pain Management:    Induction: Intravenous  PONV Risk Score and Plan: 2 and Ondansetron and Dexamethasone  Airway Management Planned: Oral ETT  Additional Equipment:   Intra-op Plan:   Post-operative Plan: Extubation in OR  Informed Consent: I have reviewed the patients History and Physical, chart, labs and discussed the procedure including the risks, benefits and alternatives for the proposed anesthesia with the patient or authorized representative who has  indicated his/her understanding and acceptance.   Dental Advisory Given  Plan Discussed with: Anesthesiologist, CRNA and Surgeon  Anesthesia Plan Comments:         Anesthesia Quick Evaluation
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
## 2017-08-16 NOTE — Progress Notes (Signed)
Patient tolerated po sprote well.

## 2017-08-16 NOTE — Transfer of Care (Signed)
Immediate Anesthesia Transfer of Care Note  Patient: Francis Schaefer  Procedure(s) Performed: LAPAROSCOPIC CHOLECYSTECTOMY, poss open (N/A )  Patient Location: PACU  Anesthesia Type:General  Level of Consciousness: sedated and responds to stimulation  Airway & Oxygen Therapy: Patient Spontanous Breathing and Patient connected to face mask oxygen  Post-op Assessment: Report given to RN and Post -op Vital signs reviewed and stable  Post vital signs: Reviewed and stable  Last Vitals:  Vitals:   08/16/17 0812 08/16/17 1001  BP: 99/81 129/67  Pulse: 81 66  Resp: 18   Temp: (!) 36 C   SpO2: 99% 96%    Last Pain:  Vitals:   08/16/17 0812  TempSrc: Tympanic  PainSc: 0-No pain         Complications: No apparent anesthesia complications

## 2017-08-16 NOTE — Anesthesia Procedure Notes (Addendum)
Procedure Name: Intubation Performed by: Lance Muss, CRNA Pre-anesthesia Checklist: Patient identified, Patient being monitored, Timeout performed, Emergency Drugs available and Suction available Patient Re-evaluated:Patient Re-evaluated prior to induction Oxygen Delivery Method: Circle system utilized Preoxygenation: Pre-oxygenation with 100% oxygen Induction Type: IV induction Ventilation: Mask ventilation without difficulty Laryngoscope Size: Mac and 3 Grade View: Grade II Tube type: Oral Tube size: 7.5 mm Number of attempts: 1 Airway Equipment and Method: Stylet Placement Confirmation: ETT inserted through vocal cords under direct vision,  positive ETCO2 and breath sounds checked- equal and bilateral Secured at: 23 cm Tube secured with: Tape Dental Injury: Teeth and Oropharynx as per pre-operative assessment

## 2017-08-16 NOTE — Discharge Instructions (Signed)
Remove dressing in 24 hours. °May shower in 24 hours. °Leave paper strips in place. °Resume all home medications. °Follow-up with Dr. Cooper in 10 days. ° °AMBULATORY SURGERY  °DISCHARGE INSTRUCTIONS ° ° °1) The drugs that you were given will stay in your system until tomorrow so for the next 24 hours you should not: ° °A) Drive an automobile °B) Make any legal decisions °C) Drink any alcoholic beverage ° ° °2) You may resume regular meals tomorrow.  Today it is better to start with liquids and gradually work up to solid foods. ° °You may eat anything you prefer, but it is better to start with liquids, then soup and crackers, and gradually work up to solid foods. ° ° °3) Please notify your doctor immediately if you have any unusual bleeding, trouble breathing, redness and pain at the surgery site, drainage, fever, or pain not relieved by medication. ° ° ° °4) Additional Instructions: ° ° ° ° ° ° ° °Please contact your physician with any problems or Same Day Surgery at [phone], Monday through Friday 6 am to 4 pm, or [place] [facility] [place] number at [phone]. °

## 2017-08-16 NOTE — Progress Notes (Signed)
Preoperative Review   Patient is met in the preoperative holding area. The history is reviewed in the chart and with the patient. I personally reviewed the options and rationale as well as the risks of this procedure that have been previously discussed with the patient.  I especially reviewed the risks of conversion to an open procedure or bowel injury secondary to his long midline incision.  Alternative means to obtain access to the abdomen with the laparoscope was discussed as well.  All questions asked by the patient and/or family were answered to their satisfaction.  Patient agrees to proceed with this procedure at this time.  Florene Glen M.D. FACS

## 2017-08-17 ENCOUNTER — Telehealth: Payer: Self-pay | Admitting: Surgery

## 2017-08-17 ENCOUNTER — Encounter: Payer: Self-pay | Admitting: Surgery

## 2017-08-17 LAB — SURGICAL PATHOLOGY

## 2017-08-17 NOTE — Telephone Encounter (Signed)
Patient came by and dropped office fmla paperwork, payment has been paid and placed in folder.

## 2017-08-18 NOTE — Anesthesia Postprocedure Evaluation (Signed)
Anesthesia Post Note  Patient: Dorann OuGilberto Gardiner  Procedure(s) Performed: LAPAROSCOPIC CHOLECYSTECTOMY, poss open (N/A )  Patient location during evaluation: PACU Anesthesia Type: General Level of consciousness: awake and alert Pain management: pain level controlled Vital Signs Assessment: post-procedure vital signs reviewed and stable Respiratory status: spontaneous breathing, nonlabored ventilation, respiratory function stable and patient connected to nasal cannula oxygen Cardiovascular status: blood pressure returned to baseline and stable Postop Assessment: no apparent nausea or vomiting Anesthetic complications: no     Last Vitals:  Vitals:   08/16/17 1050 08/16/17 1106  BP: (!) 112/58 (!) 103/57  Pulse: 65 68  Resp: 16   Temp: (!) 36.2 C   SpO2: 97% 98%    Last Pain:  Vitals:   08/17/17 0844  TempSrc:   PainSc: 1                  Lenard SimmerAndrew Toby Ayad

## 2017-08-18 NOTE — Telephone Encounter (Signed)
Patients FMLA paperwork has been faxed and patient has been notified.

## 2017-08-24 ENCOUNTER — Ambulatory Visit (INDEPENDENT_AMBULATORY_CARE_PROVIDER_SITE_OTHER): Payer: BLUE CROSS/BLUE SHIELD | Admitting: Surgery

## 2017-08-24 ENCOUNTER — Encounter: Payer: Self-pay | Admitting: Surgery

## 2017-08-24 VITALS — BP 120/74 | HR 74 | Temp 97.8°F | Wt 153.0 lb

## 2017-08-24 DIAGNOSIS — K8020 Calculus of gallbladder without cholecystitis without obstruction: Secondary | ICD-10-CM

## 2017-08-24 DIAGNOSIS — E039 Hypothyroidism, unspecified: Secondary | ICD-10-CM | POA: Insufficient documentation

## 2017-08-24 DIAGNOSIS — F1021 Alcohol dependence, in remission: Secondary | ICD-10-CM | POA: Insufficient documentation

## 2017-08-24 DIAGNOSIS — E038 Other specified hypothyroidism: Secondary | ICD-10-CM | POA: Insufficient documentation

## 2017-08-24 DIAGNOSIS — N529 Male erectile dysfunction, unspecified: Secondary | ICD-10-CM | POA: Insufficient documentation

## 2017-08-24 NOTE — Progress Notes (Signed)
Outpatient postop visit  08/24/2017  Francis Schaefer is an 61 y.o. male.    Procedure: Laparoscopic cholecystectomy  CC: No problems  HPI: Status post laparoscopic cholecystectomy.  He has no problems at this time is eating well no fevers chills no jaundice or acholic stools  Medications reviewed.    Physical Exam:  BP 120/74   Pulse 74   Temp 97.8 F (36.6 C) (Oral)   Wt 153 lb (69.4 kg)   BMI 27.10 kg/m     PE: No icterus no jaundice vital signs stable abdomen is soft nontender nondistended nontympanitic wounds are clean no erythema no drainage Steri-Strips in place    Assessment/Plan:  Patient doing very well recommend follow-up on an as-needed basis his pathology is confirmed and no malignancy.  He will return to work in 2 weeks.  Lattie Hawichard E Cooper, MD, FACS

## 2017-08-24 NOTE — Patient Instructions (Signed)

## 2017-08-26 ENCOUNTER — Encounter: Payer: BLUE CROSS/BLUE SHIELD | Admitting: General Surgery

## 2017-09-14 ENCOUNTER — Telehealth: Payer: Self-pay

## 2017-09-14 NOTE — Telephone Encounter (Signed)
Patients disability paperwork has been completed and faxed at this time. Paperwork left up front for his pick up.

## 2017-10-24 ENCOUNTER — Telehealth: Payer: Self-pay | Admitting: General Practice

## 2017-10-24 NOTE — Telephone Encounter (Signed)
Patient came by the office and is needing a letter for work on his disability paperwork, we needed to change the date on the paperwork where it had the second and we had to change the to the twenty-second patient is needing a letter to state the change. Please call patient and advise.

## 2017-10-26 NOTE — Telephone Encounter (Signed)
Left a message letting the patient know his letter was ready for pickup.

## 2017-11-02 NOTE — Telephone Encounter (Signed)
Patient came by yesterday and picked up letter

## 2017-12-07 ENCOUNTER — Ambulatory Visit
Admission: RE | Admit: 2017-12-07 | Payer: BLUE CROSS/BLUE SHIELD | Source: Ambulatory Visit | Admitting: Unknown Physician Specialty

## 2017-12-07 ENCOUNTER — Encounter: Admission: RE | Payer: Self-pay | Source: Ambulatory Visit

## 2017-12-07 SURGERY — COLONOSCOPY WITH PROPOFOL
Anesthesia: General

## 2018-01-05 ENCOUNTER — Encounter: Payer: Self-pay | Admitting: Student

## 2018-01-06 ENCOUNTER — Encounter: Payer: Self-pay | Admitting: *Deleted

## 2018-01-06 ENCOUNTER — Encounter
Admission: RE | Disposition: A | Discharge status: Home / Self Care | Payer: Self-pay | Source: Ambulatory Visit | Attending: Unknown Physician Specialty

## 2018-01-06 ENCOUNTER — Ambulatory Visit
Admission: RE | Admit: 2018-01-06 | Discharge: 2018-01-06 | Disposition: A | Discharge status: Home / Self Care | Payer: BLUE CROSS/BLUE SHIELD | Source: Ambulatory Visit | Attending: Unknown Physician Specialty | Admitting: Unknown Physician Specialty

## 2018-01-06 ENCOUNTER — Ambulatory Visit: Payer: BLUE CROSS/BLUE SHIELD | Admitting: Anesthesiology

## 2018-01-06 DIAGNOSIS — E785 Hyperlipidemia, unspecified: Secondary | ICD-10-CM | POA: Insufficient documentation

## 2018-01-06 DIAGNOSIS — I10 Essential (primary) hypertension: Secondary | ICD-10-CM | POA: Diagnosis not present

## 2018-01-06 DIAGNOSIS — Z79899 Other long term (current) drug therapy: Secondary | ICD-10-CM | POA: Diagnosis not present

## 2018-01-06 DIAGNOSIS — K640 First degree hemorrhoids: Secondary | ICD-10-CM | POA: Insufficient documentation

## 2018-01-06 DIAGNOSIS — K621 Rectal polyp: Secondary | ICD-10-CM | POA: Diagnosis not present

## 2018-01-06 DIAGNOSIS — Z7982 Long term (current) use of aspirin: Secondary | ICD-10-CM | POA: Insufficient documentation

## 2018-01-06 DIAGNOSIS — E039 Hypothyroidism, unspecified: Secondary | ICD-10-CM | POA: Diagnosis not present

## 2018-01-06 DIAGNOSIS — Z7984 Long term (current) use of oral hypoglycemic drugs: Secondary | ICD-10-CM | POA: Insufficient documentation

## 2018-01-06 DIAGNOSIS — K5730 Diverticulosis of large intestine without perforation or abscess without bleeding: Secondary | ICD-10-CM | POA: Insufficient documentation

## 2018-01-06 DIAGNOSIS — K219 Gastro-esophageal reflux disease without esophagitis: Secondary | ICD-10-CM | POA: Diagnosis not present

## 2018-01-06 DIAGNOSIS — Z1211 Encounter for screening for malignant neoplasm of colon: Secondary | ICD-10-CM | POA: Insufficient documentation

## 2018-01-06 DIAGNOSIS — E119 Type 2 diabetes mellitus without complications: Secondary | ICD-10-CM | POA: Diagnosis not present

## 2018-01-06 DIAGNOSIS — Z87891 Personal history of nicotine dependence: Secondary | ICD-10-CM | POA: Insufficient documentation

## 2018-01-06 HISTORY — DX: Alcohol dependence, in remission: F10.21

## 2018-01-06 HISTORY — DX: Male erectile dysfunction, unspecified: N52.9

## 2018-01-06 HISTORY — DX: Hypothyroidism, unspecified: E03.9

## 2018-01-06 HISTORY — PX: COLONOSCOPY WITH PROPOFOL: SHX5780

## 2018-01-06 LAB — GLUCOSE, CAPILLARY: GLUCOSE-CAPILLARY: 209 mg/dL — AB (ref 65–99)

## 2018-01-06 SURGERY — COLONOSCOPY WITH PROPOFOL
Anesthesia: General

## 2018-01-06 MED ORDER — PROPOFOL 10 MG/ML IV BOLUS
INTRAVENOUS | Status: DC | PRN
  Administered 2018-01-06: 20 mg via INTRAVENOUS
  Administered 2018-01-06: 30 mg via INTRAVENOUS

## 2018-01-06 MED ORDER — LIDOCAINE HCL (PF) 2 % IJ SOLN
INTRAMUSCULAR | Status: AC
  Filled 2018-01-06: qty 10

## 2018-01-06 MED ORDER — SODIUM CHLORIDE 0.9 % IV SOLN
INTRAVENOUS | Status: DC
  Administered 2018-01-06: 1000 mL via INTRAVENOUS

## 2018-01-06 MED ORDER — MIDAZOLAM HCL 2 MG/2ML IJ SOLN
INTRAMUSCULAR | Status: AC
  Filled 2018-01-06: qty 2

## 2018-01-06 MED ORDER — FENTANYL CITRATE (PF) 100 MCG/2ML IJ SOLN
INTRAMUSCULAR | Status: DC | PRN
  Administered 2018-01-06 (×2): 50 ug via INTRAVENOUS

## 2018-01-06 MED ORDER — PROPOFOL 500 MG/50ML IV EMUL
INTRAVENOUS | Status: AC
Start: 2018-01-06 — End: 2018-01-06
  Filled 2018-01-06: qty 50

## 2018-01-06 MED ORDER — PROPOFOL 500 MG/50ML IV EMUL
INTRAVENOUS | Status: AC
  Filled 2018-01-06: qty 50

## 2018-01-06 MED ORDER — FENTANYL CITRATE (PF) 100 MCG/2ML IJ SOLN
INTRAMUSCULAR | Status: AC
  Filled 2018-01-06: qty 2

## 2018-01-06 MED ORDER — MIDAZOLAM HCL 5 MG/5ML IJ SOLN
INTRAMUSCULAR | Status: DC | PRN
  Administered 2018-01-06: 2 mg via INTRAVENOUS

## 2018-01-06 MED ORDER — LIDOCAINE HCL (PF) 2 % IJ SOLN
INTRAMUSCULAR | Status: DC | PRN
  Administered 2018-01-06: 60 mg

## 2018-01-06 MED ORDER — SODIUM CHLORIDE 0.9 % IV SOLN: INTRAVENOUS | Status: DC

## 2018-01-06 MED ORDER — PROPOFOL 500 MG/50ML IV EMUL
INTRAVENOUS | Status: DC | PRN
  Administered 2018-01-06: 75 ug/kg/min via INTRAVENOUS

## 2018-01-06 NOTE — Op Note (Signed)
[facility] Gastroenterology Patient Name: Francis Schaefer Procedure Date: 01/06/2018 12:01 PM MRN: [id number] Account #: [id number] Date of Birth: 1956/07/23 Admit Type: Outpatient Age: 62 Room: [facility] ENDO ROOM 3 Gender: Male Note Status: Finalized Procedure:            Colonoscopy Indications:          Screening for colorectal malignant neoplasm Providers:            Scot Jun, MD Referring MD:         Nat Christen. Zada Finders, MD (Referring MD) Medicines:            Propofol per Anesthesia Complications:        No immediate complications. Procedure:            Pre-Anesthesia Assessment:                       - After reviewing the risks and benefits, the patient                        was deemed in satisfactory condition to undergo the                        procedure.                       After obtaining informed consent, the colonoscope was                        passed under direct vision. Throughout the procedure,                        the patient's blood pressure, pulse, and oxygen                        saturations were monitored continuously. The                        Colonoscope was introduced through the anus and                        advanced to the the cecum, identified by appendiceal                        orifice and ileocecal valve. The colonoscopy was                        performed without difficulty. The patient tolerated the                        procedure well. The quality of the bowel preparation                        was excellent. Findings:      A small polyp was found in the rectum. The polyp was pedunculated. The       polyp was removed with a hot snare. Resection and retrieval were       complete.      A few medium-mouthed diverticula were found in the ascending colon.      Internal hemorrhoids were found during endoscopy. The hemorrhoids were       small and Grade I (  internal hemorrhoids that do not prolapse).      The exam was  otherwise without abnormality. Impression:           - One small polyp in the rectum, removed with a hot                        snare. Resected and retrieved.                       - Diverticulosis in the ascending colon.                       - Internal hemorrhoids.                       - The examination was otherwise normal. Recommendation:       - Await pathology results. Scot Jun, MD 01/06/2018 09:81:19 PM This report has been signed electronically. Number of Addenda: 0 Note Initiated On: 01/06/2018 12:01 PM Scope Withdrawal Time: 0 hours 12 minutes 10 seconds  Total Procedure Duration: 0 hours 18 minutes 8 seconds       [employer]

## 2018-01-06 NOTE — Anesthesia Post-op Follow-up Note (Signed)
Anesthesia QCDR form completed.        
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
## 2018-01-06 NOTE — Transfer of Care (Signed)
Immediate Anesthesia Transfer of Care Note  Patient: Francis Schaefer  Procedure(s) Performed: COLONOSCOPY WITH PROPOFOL (N/A )  Patient Location: PACU  Anesthesia Type:General  Level of Consciousness: sedated  Airway & Oxygen Therapy: Patient Spontanous Breathing and Patient connected to nasal cannula oxygen  Post-op Assessment: Report given to RN and Post -op Vital signs reviewed and stable  Post vital signs: Reviewed and stable  Last Vitals:  Vitals Value Taken Time  BP    Temp    Pulse    Resp    SpO2      Last Pain:  Vitals:   01/06/18 1007  TempSrc: Tympanic  PainSc: 0-No pain         Complications: No apparent anesthesia complications

## 2018-01-06 NOTE — OR Nursing (Signed)
Delay d/t pt drinking water up until 10am
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
## 2018-01-06 NOTE — Anesthesia Postprocedure Evaluation (Signed)
Anesthesia Post Note  Patient: Francis Schaefer  Procedure(s) Performed: COLONOSCOPY WITH PROPOFOL (N/A )  Patient location during evaluation: Endoscopy Anesthesia Type: General Level of consciousness: awake and alert Pain management: pain level controlled Vital Signs Assessment: post-procedure vital signs reviewed and stable Respiratory status: spontaneous breathing, nonlabored ventilation, respiratory function stable and patient connected to nasal cannula oxygen Cardiovascular status: blood pressure returned to baseline and stable Postop Assessment: no apparent nausea or vomiting Anesthetic complications: no     Last Vitals:  Vitals:   01/06/18 1241 01/06/18 1251  BP: 117/72 138/83  Pulse: (!) 49 (!) 50  Resp: 12 10  Temp:    SpO2: 98% 97%    Last Pain:  Vitals:   01/06/18 1251  TempSrc:   PainSc: 0-No pain                 Cashton Hosley S

## 2018-01-09 ENCOUNTER — Encounter: Payer: Self-pay | Admitting: Unknown Physician Specialty

## 2018-01-09 LAB — SURGICAL PATHOLOGY

## 2018-07-17 ENCOUNTER — Other Ambulatory Visit: Payer: Self-pay

## 2018-07-17 ENCOUNTER — Encounter: Payer: Self-pay | Admitting: Emergency Medicine

## 2018-07-17 ENCOUNTER — Ambulatory Visit (INDEPENDENT_AMBULATORY_CARE_PROVIDER_SITE_OTHER): Payer: BLUE CROSS/BLUE SHIELD

## 2018-07-17 ENCOUNTER — Ambulatory Visit
Admission: EM | Admit: 2018-07-17 | Discharge: 2018-07-17 | Disposition: A | Discharge status: Home / Self Care | Payer: BLUE CROSS/BLUE SHIELD | Attending: Family Medicine | Admitting: Family Medicine

## 2018-07-17 DIAGNOSIS — M25541 Pain in joints of right hand: Secondary | ICD-10-CM

## 2018-07-17 DIAGNOSIS — M79641 Pain in right hand: Secondary | ICD-10-CM

## 2018-07-17 MED ORDER — CEPHALEXIN 500 MG PO CAPS: 500.0000 mg | ORAL_CAPSULE | Freq: Three times a day (TID) | ORAL | 0 refills | Status: AC

## 2018-07-17 MED ORDER — PREDNISONE 10 MG PO TABS: ORAL_TABLET | ORAL | 0 refills | Status: DC
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
## 2018-07-17 NOTE — Discharge Instructions (Signed)
Take medication as prescribed. Rest. Drink plenty of fluids.  Monitor blood sugar at home as discussed.  Follow up with your primary care physician this week as needed. Return to Urgent care for new or worsening concerns.

## 2018-07-17 NOTE — ED Triage Notes (Signed)
Patient c/o right hand swelling, pain and redness that started 6 days ago. Denies injury.

## 2018-07-25 ENCOUNTER — Other Ambulatory Visit: Payer: Self-pay

## 2018-07-25 ENCOUNTER — Ambulatory Visit
Admission: EM | Admit: 2018-07-25 | Discharge: 2018-07-25 | Disposition: A | Discharge status: Home / Self Care | Payer: BLUE CROSS/BLUE SHIELD | Attending: Family Medicine | Admitting: Family Medicine

## 2018-07-25 DIAGNOSIS — M79641 Pain in right hand: Secondary | ICD-10-CM

## 2018-07-25 MED ORDER — INDOMETHACIN 50 MG PO CAPS: ORAL_CAPSULE | ORAL | 0 refills | Status: AC

## 2018-07-25 NOTE — ED Triage Notes (Signed)
Pt diagnosed with gout in right hand here 8 days ago per pt. Finished the 7 day treatment and now swelling and pain have returned
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
## 2018-07-25 NOTE — Discharge Instructions (Signed)
Medication as prescribed. ° °Follow up with PCP. ° °Take care ° °Dr. Jubilee Vivero  °

## 2018-08-22 IMAGING — US US ABDOMEN LIMITED
1 series · 14 of 25 positions shown · non-contrast
Comparison: None.

CLINICAL DATA: RIGHT upper quadrant pain

EXAM:
ULTRASOUND ABDOMEN LIMITED RIGHT UPPER QUADRANT

[Series 1: us abdomen limited · 0.20mm/px · 14 of 51 slices shown]
[im 1/51]
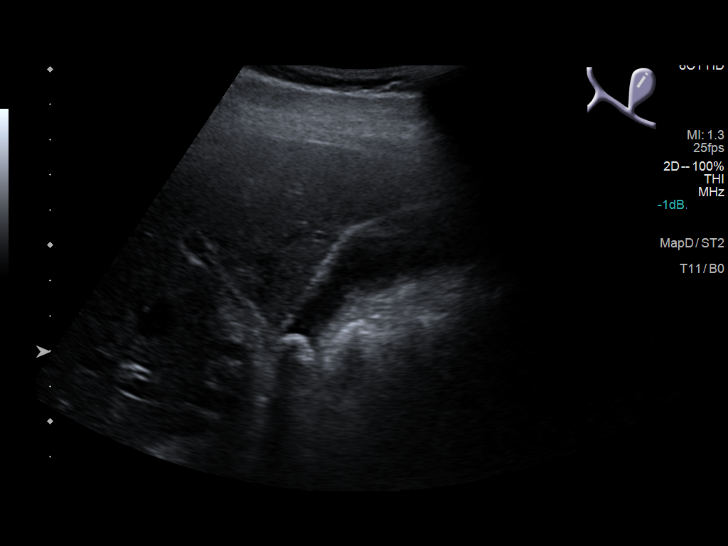
[im 5/51]
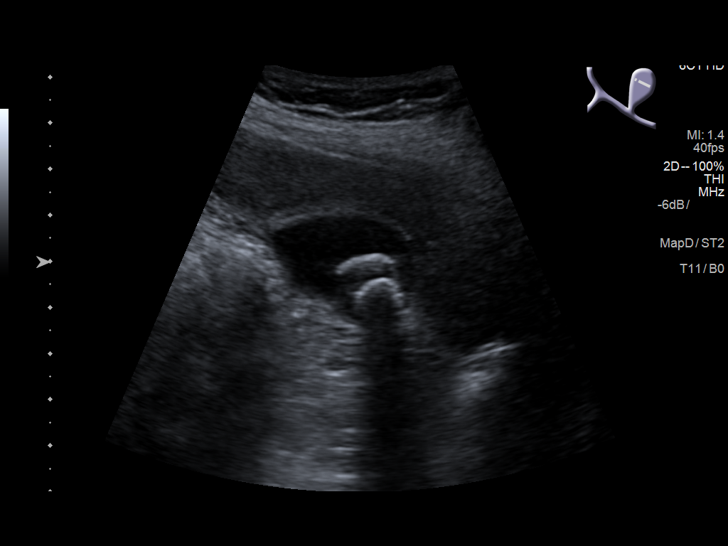
[im 9/51]
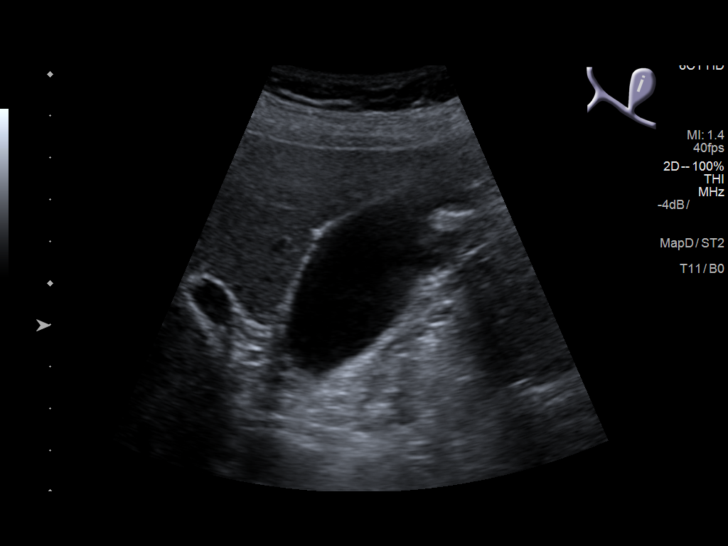
[im 13/51]
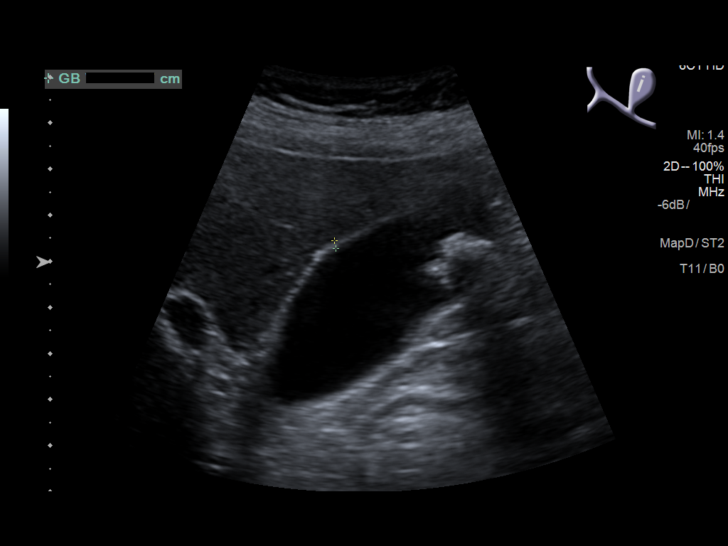
[im 17/51]
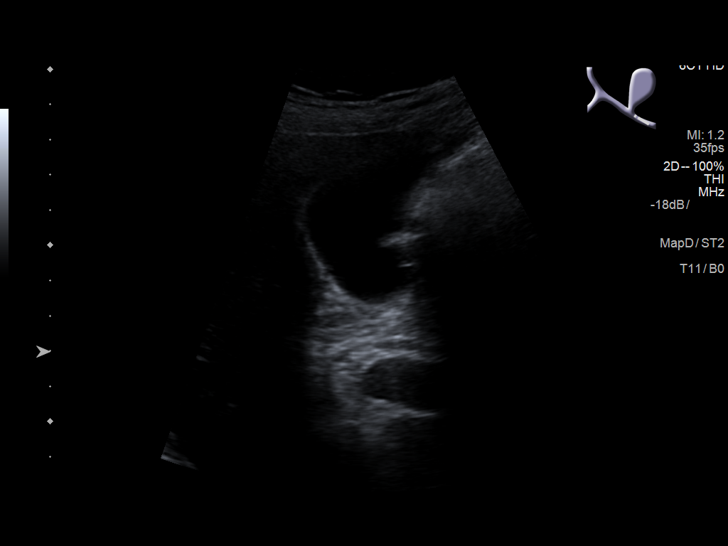
[im 19/51]
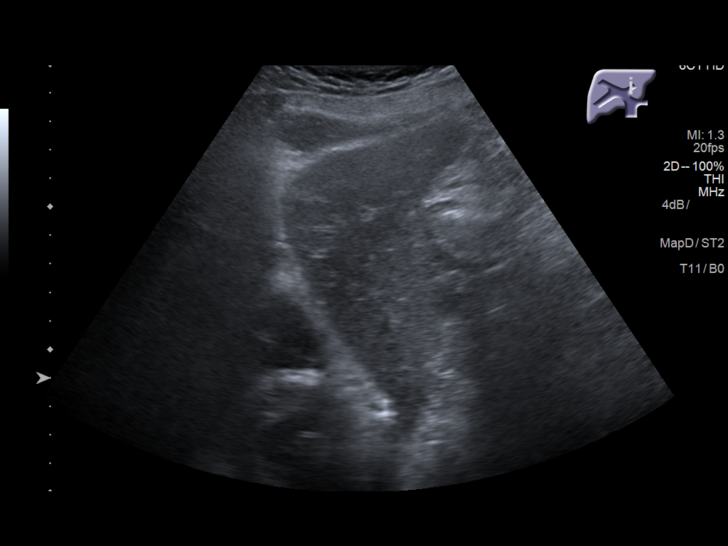
[im 23/51]
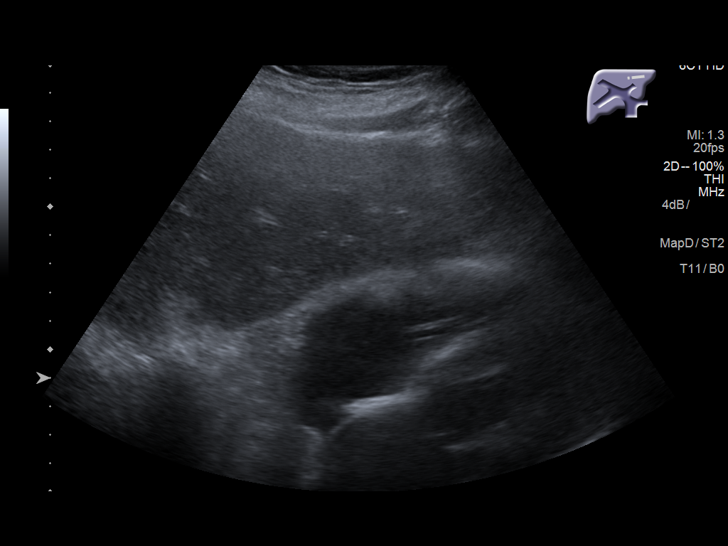
[im 28/51]
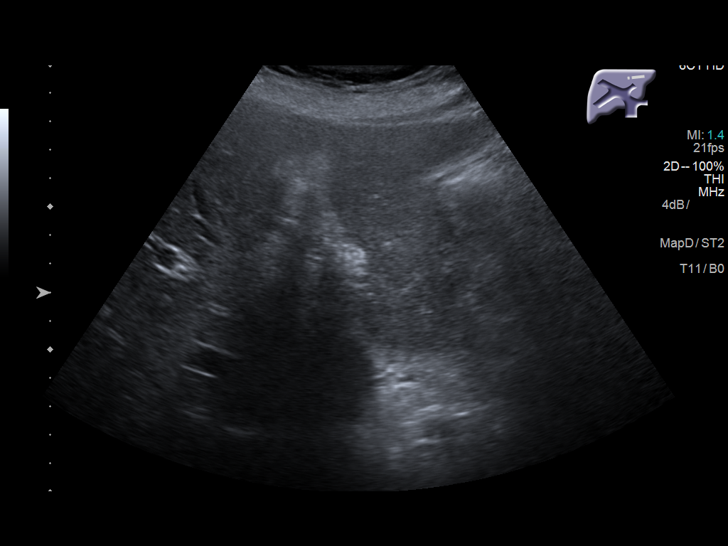
[im 32/51]
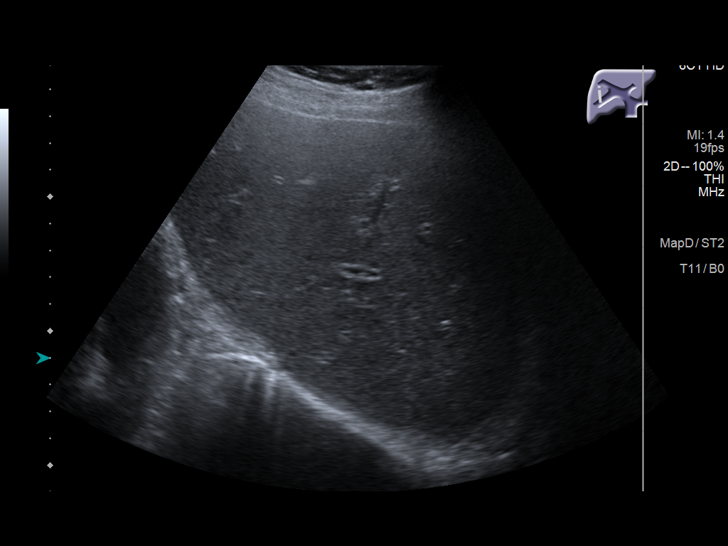
[im 34/51]
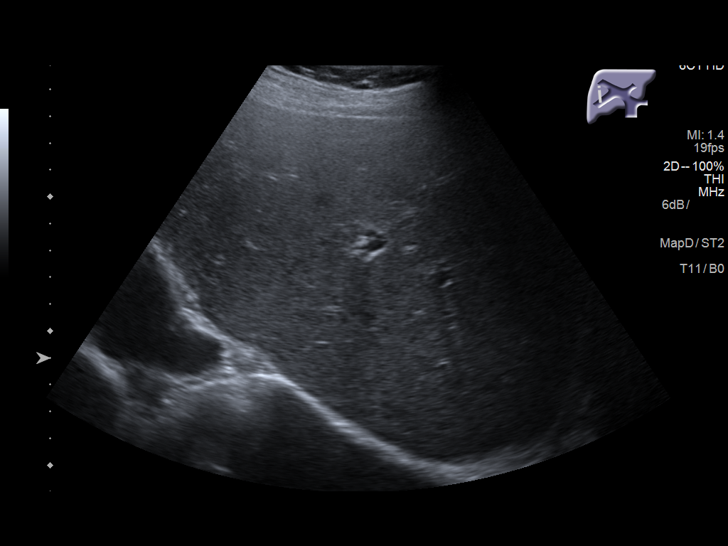
[im 38/51]
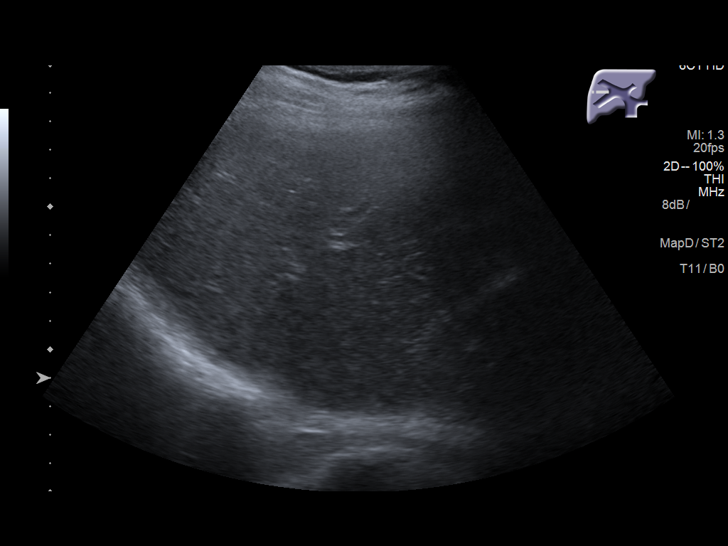
[im 42/51]
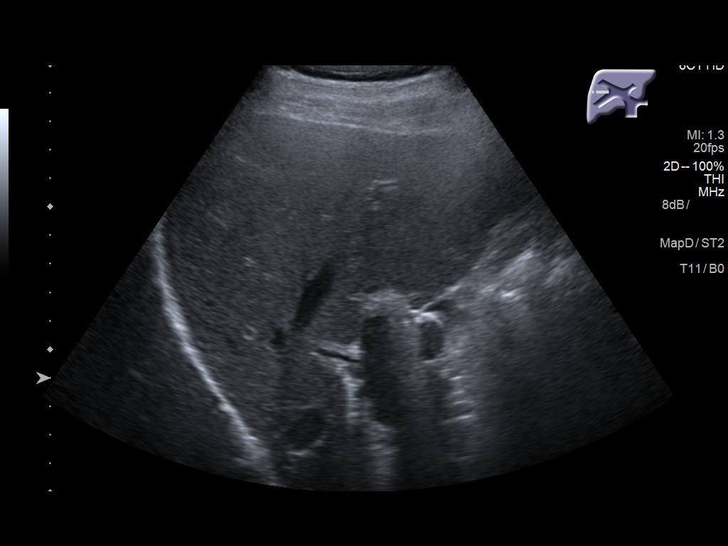
[im 46/51]
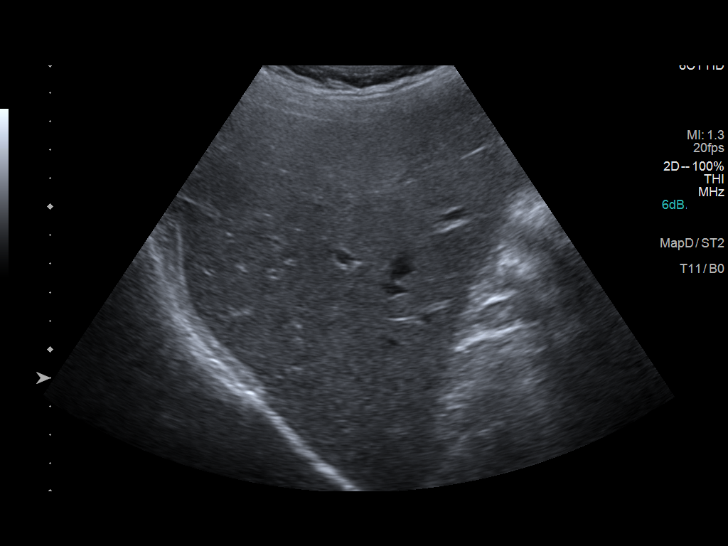
[im 51/51]
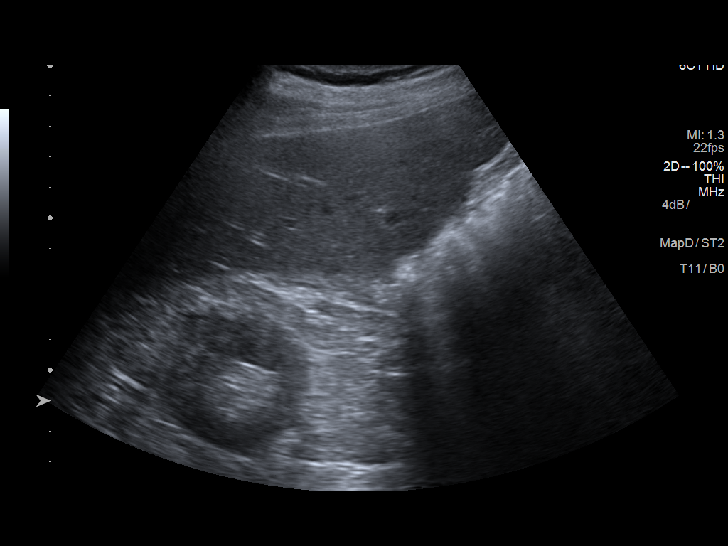

[14 of 25 positions shown; findings below may reference images not displayed]

FINDINGS: Gallbladder:

Two mobile gallstones measuring up to 1.4 cm. No gallbladder wall
thickening or pericholecystic fluid. Negative sonographic Murphy's
sign.

Common bile duct:

Diameter: Normal 4 mm.

Liver:

No focal lesion identified. Within normal limits in parenchymal
echogenicity. Portal vein is patent on color Doppler imaging with
normal direction of blood flow towards the liver.
IMPRESSION: 1. Cholelithiasis without evidence of cholecystitis.
2. No biliary dilatation.

## 2019-07-20 ENCOUNTER — Emergency Department: Payer: Self-pay

## 2019-07-20 ENCOUNTER — Emergency Department
Admission: EM | Admit: 2019-07-20 | Discharge: 2019-07-21 | Disposition: A | Discharge status: Home / Self Care | Attending: Emergency Medicine | Admitting: Emergency Medicine

## 2019-07-20 DIAGNOSIS — I10 Essential (primary) hypertension: Secondary | ICD-10-CM | POA: Insufficient documentation

## 2019-07-20 DIAGNOSIS — R112 Nausea with vomiting, unspecified: Secondary | ICD-10-CM | POA: Insufficient documentation

## 2019-07-20 DIAGNOSIS — E119 Type 2 diabetes mellitus without complications: Secondary | ICD-10-CM | POA: Insufficient documentation

## 2019-07-20 DIAGNOSIS — Z9049 Acquired absence of other specified parts of digestive tract: Secondary | ICD-10-CM | POA: Insufficient documentation

## 2019-07-20 HISTORY — DX: Type 2 diabetes mellitus without complications (CMS-HCC): E11.9

## 2019-07-20 HISTORY — DX: Essential (primary) hypertension: I10

## 2019-07-20 LAB — CBC WITH DIFF, BLOOD
ANC automated: 3.3 10*3/uL (ref 2.0–8.1)
Basophils %: 0.4 %
Basophils Absolute: 0 10*3/uL (ref 0.0–0.2)
Eosinophils %: 2.3 %
Eosinophils Absolute: 0.2 10*3/uL (ref 0.0–0.5)
Hematocrit: 40.2 % (ref 39.5–50.0)
Hgb: 13.8 G/DL (ref 13.5–16.9)
Lymphocytes %: 37.8 %
Lymphocytes Absolute: 2.6 10*3/uL (ref 0.9–3.3)
MCH: 30.1 PG (ref 27.0–33.5)
MCHC: 34.4 G/DL (ref 32.0–35.5)
MCV: 87.4 FL (ref 81.5–97.0)
MPV: 9.4 FL (ref 7.2–11.7)
Monocytes %: 10.9 %
Monocytes Absolute: 0.7 10*3/uL (ref 0.0–0.8)
Neutrophils % (A): 48.6 %
PLT Count: 225 10*3/uL (ref 150–400)
RBC: 4.6 10*6/uL (ref 4.38–5.62)
RDW-CV: 13.4 % (ref 11.6–14.4)
White Bld Cell Count: 6.8 10*3/uL (ref 4.0–10.5)

## 2019-07-20 LAB — COMPREHENSIVE METABOLIC PANEL, BLOOD
ALT: 21 U/L (ref 7–52)
AST: 18 U/L (ref 13–39)
Albumin: 4.3 G/DL (ref 4.2–5.5)
Alk Phos: 108 U/L — ABNORMAL HIGH (ref 34–104)
BUN: 42 mg/dL — ABNORMAL HIGH (ref 7–25)
Bilirubin, Total: 0.7 mg/dL (ref 0.0–1.4)
CO2: 30 mmol/L (ref 21–31)
Calcium: 10 mg/dL (ref 8.6–10.3)
Chloride: 96 mmol/L — ABNORMAL LOW (ref 98–107)
Creat: 1.4 mg/dL — ABNORMAL HIGH (ref 0.7–1.3)
Electrolyte Balance: 7 mmol/L (ref 2–12)
Glucose: 212 mg/dL — ABNORMAL HIGH (ref 85–125)
Potassium: 4.5 mmol/L (ref 3.5–5.1)
Protein, Total: 7.5 G/DL (ref 6.0–8.3)
Sodium: 133 mmol/L — ABNORMAL LOW (ref 136–145)
eGFR - high estimate: >60 (ref >59)
eGFR - low estimate: 51 — ABNORMAL LOW (ref >59)

## 2019-07-20 LAB — TROPONIN I, HIGH SENSITIVITY: Troponin I, High Sensitivity: 5 ng/L (ref 0–20)

## 2019-07-20 LAB — LIPASE, BLOOD: Lipase: 41 U/L (ref 11–82)

## 2019-07-20 MED ORDER — SODIUM CHLORIDE 0.9 % IJ SOLN (CUSTOM)
50.0000 mL | Freq: Once | INTRAMUSCULAR | Status: DC | PRN
Start: 2019-07-20 — End: 2019-07-21

## 2019-07-20 MED ORDER — SODIUM CHLORIDE FLUSH 0.9 % IV SOLN
10.0000 mL | Freq: Once | INTRAVENOUS | Status: DC | PRN
Start: 2019-07-20 — End: 2019-07-21

## 2019-07-20 MED ORDER — LACTATED RINGERS IV BOLUS (~~LOC~~)
1000.0000 mL | Freq: Once | INTRAVENOUS | Status: AC
Start: 2019-07-20 — End: 2019-07-20
  Administered 2019-07-20 (×2): 1000 mL via INTRAVENOUS

## 2019-07-20 MED ORDER — IOHEXOL 350 MG/ML IV SOLN
100.0000 mL | Freq: Once | INTRAVENOUS | Status: AC
Start: 2019-07-20 — End: 2019-07-20
  Administered 2019-07-20 (×2): 100 mL via INTRAVENOUS

## 2019-07-20 MED ORDER — LACTATED RINGERS IV BOLUS (~~LOC~~)
1000.0000 mL | Freq: Once | INTRAVENOUS | Status: AC
Start: 2019-07-20 — End: 2019-07-20
  Administered 2019-07-20 (×2): 1000 mL via INTRAVENOUS

## 2019-07-20 NOTE — ED EKG Interpretation (Signed)
ED EKG Interpretation    EKG Interpretation:    Rate: 85  Rhythm: normal sinus rhythm  Intervals: wnl  Other: no ectopy, no emergent ST-T abnormalities      Colena Ketterman, MD   [facility]   Assistant Professor of Emergency Medicine

## 2019-07-20 NOTE — ED Provider Notes (Addendum)
CHIEF COMPLAINT  Abdominal Pain (x one week)      HISTORY OF PRESENT ILLNESS:   Craig James is a 63 year old male who presents with epigastric abdominal pain.    Location: epigastric  Radiation: none  Quality: pressure   Severity: moderate  Duration: 5 days  Timing: constant  Context: reports nausea and vomiting, worse after eating. Unable to tolerate PO. Denies fevers.      REVIEW OF SYSTEMS:  Constitutional: no fever  Head: no headache  CV: no chest pain  Resp: no shortness of breath  GI: + vomiting  Back: no back pain  Skin: no rash  Neuro: no focal weakness    All other systems reviewed and negative except as noted above    PAST MEDICAL HISTORY:  Past Medical History:   Diagnosis Date   . Diabetes mellitus (CMS-HCC)    . Hypertension         SURGICAL HISTORY:  Ex-lap  Cholecystectomy       ALLERGIES:  No Known Allergies      CURRENT MEDICATIONS:   Please see nursing notes    FAMILY HISTORY:  Reviewed and noncontributory      SOCIAL HISTORY:  Tobacco: no  Alcohol: no  Drug use: no    VITAL SIGNS:  BP 140/72   Pulse 74   Temp 98.6 F (37 C)   Resp 16   Ht 5\' 4"  (1.626 m)   Wt 64 kg (141 lb 1.5 oz)   SpO2 97%   BMI 24.22 kg/m     PHYSICAL EXAM:  General: Awake, alert, appears well, appears to be in no acute distress   Head: Normocephalic, atraumatic   Eyes: No scleral icterus, no conjunctival injection, extraocular motion intact, PERRL   ENT: Normal appearing ears externally, normal appearing nose externally   Neck: Supple, full range of motion of neck   Respiratory: Normal effort, no audible stridor, lungs CTAB   Cardiovascular: Regular rhythm, normal rate, no murmurs, well perfused   Abdomen: Soft, mild epigastric tenderness with palpable mass/scar tissue in epigastrum, non-distended. Ex-lap scar in midline.  Back: no costovertebral angle tenderness  Skin: No jaundice, no rash   Extremities: No upper extremity asymmetry, no lower extremity asymmetry   Neuro: Awake, alert, moving all extremities      Psych: Conversing appropriately, appropriate mood and affect, memory intact      MEDICAL DECISION MAKING:  Craig James is a 63 year old male who presents with abdominal pain.   No chest pain or sob, no active vomiting    Patient appears well here in the ed      Differential diagnosis includes gastroenteritis, viral syndrome, sbo, ileus, pancreatitis, hepatitis, gastroenteritis, less likely diverticulitis, AAA. Pt had a cholecystectomy in the past, doubt biliary pathology, does not meet cholangitis criteria.   Less likely pna given no fever, no cough. Less liikely acs given no chest pain and mild reproducible abdominal tenderness on exam. ekg and trop non ischemic.     Plan:   Given hx and evidence or prior bowel surgerry wiill obtain labs, CT.    Workup Summary       Value   Comment By Time    Creatinine: 1.1   Will discharge home 64, MD 11/14 0031       JR to JS: AKI. Epigastric pain. N/V. Gastroenteritis. Pending repeat BMP, possible d/c. Smart, 11-13-1981, MD 11/14 0009       TM to VO: [phone]  HTN, DM, exlap from traumatic injury, p/w 5d epigastric pain/vomiting, unable to tolerate PO. Mass palpable on exam. Pending fluids, scan, repeat fluids, repeat BMP. Dispo pending CT and BMP Kirtland Bouchard, MD 11/13 2122       Check ct and 2L IVF --> repeat cr  Annitta Jersey, MD 11/13 2044       Need a repeat cr Annitta Jersey, MD 11/13 2043    Troponin I, High Sensitivity: 5   (Reviewed) Myatt, Josue Hector, MD 11/13 1922    Lipase: 41   (Reviewed) Myatt, Josue Hector, MD 11/13 1922      Alkaline Phos: 108   (Reviewed) Myatt, Josue Hector, MD 11/13 1922    AST (SGOT): 18   (Reviewed) Myatt, Josue Hector, MD 11/13 1922    ALT (SGPT): 21   (Reviewed) Myatt, Josue Hector, MD 11/13 1922    Bilirubin, Tot: 0.7   (Reviewed) Myatt, Josue Hector, MD 11/13 1922      Creatinine: 1.4   (Reviewed) Myatt, Josue Hector, MD 11/13 1922      BUN: 42   (Reviewed) Myatt, Josue Hector, MD 11/13 1922      Sodium: 133   (Reviewed) Myatt, Josue Hector, MD 11/13 1922       Chloride: 96   (Reviewed) Myatt, Josue Hector, MD 11/13 1922       Labs with evidence of dehydration Myatt, Josue Hector, MD 11/13 1922           EMERGENCY DEPARTMENT OBSERVATION BEGUN AT 07/20/19, 1922  The patient presents to the emergency department with aki and metabolic derrangements which requires continued monitoring, serial exams, and ongoing evaluation (i.e. clinical, laboratory, radiologic) to determine the most appropriate diagnosis and post-ED management plan. The patient will be placed on a continuous cardiac monitor, have IV access established for likely intravenous fluids and/or multiple doses of medications, consultation with appropriate specialist physicians and repeated assessment of vital signs to assure continued stability. This process is likely to take at least 6 hours for completion.    Patient remains clinically stable and afebrile with stable vital signs. Will continue emergency department observation plan to determine if the patient needs admission or can be safely discharged home.    After emergency department observation, serial examinations and evaluation course has revealed no emergent pathology. Patient is clinically stable and can be discharged at this time.    ED OBSERVATION COMPLETED AT 07/21/19, 0031 AM       DIAGNOSIS:    ICD-10-CM ICD-9-CM   1. Non-intractable vomiting with nausea, unspecified vomiting type  R11.2 787.01          Myatt, Josue Hector, MD  Resident  07/21/19 1002      Attending Attestation:   I personally evaluated this patient and I agree with the history, physical, findings and plan as documented in the record. I discussed the case with the resident and we formulated the assessment and plan together. My additions or revisions are included in the record and/or below.     Zorita Pang, MD  [facility]  Assistant Professor of Emergency Medicine              Annitta Jersey, MD  07/22/19 5053       Annitta Jersey, MD  07/22/19 9767       Annitta Jersey, MD  07/22/19  Corey Skains       Annitta Jersey, MD  07/22/19 [phone]

## 2019-07-20 NOTE — ED Notes (Addendum)
Pt to bed . Placed on monitor. Abdominal pain with vomiting for the last week unable to eat solid foods he states.. Awaiting CT fluids complete. CT aware. Stable gait A & O x 4.

## 2019-07-20 NOTE — ED Notes (Signed)
Pt to CT scan ambulatory.

## 2019-07-20 NOTE — ED Notes (Signed)
Daughter marina [phone]

## 2019-07-21 LAB — BASIC METABOLIC PANEL, BLOOD
BUN: 32 mg/dL — ABNORMAL HIGH (ref 7–25)
CO2: 28 mmol/L (ref 21–31)
Calcium: 9.2 mg/dL (ref 8.6–10.3)
Chloride: 99 mmol/L (ref 98–107)
Creat: 1.1 mg/dL (ref 0.7–1.3)
Electrolyte Balance: 7 mmol/L (ref 2–12)
Glucose: 158 mg/dL — ABNORMAL HIGH (ref 85–125)
Potassium: 4 mmol/L (ref 3.5–5.1)
Sodium: 134 mmol/L — ABNORMAL LOW (ref 136–145)
eGFR - high estimate: >60 (ref >59)
eGFR - low estimate: >60 (ref >59)

## 2019-07-21 NOTE — Discharge Instructions (Signed)
Diagnosis/Evaluation:    Thank you for allowing us to care for you today at [facility]. Today, you were seen for nausea and vomiting. Based on your workup here in the emergency department, you were deemed stable for discharge.       Follow Up:  Please follow up with your regular doctor to ensure there are no other medical issues after your emergency department visit today.       Medications:  Continue your home medications as previously prescribed.    If you were prescribed any narcotic or sedative medications here in the ER, please do not drive after taking these medications and do not mix them with alcohol.     Please return to the emergency department if you experience ANY of the following:  Please return to the ER if you develop fevers, worsening of your symptoms, or have any other concerns.      If you have any outstanding lab or imaging studies at the time of your discharge, the phone number to contact us here in the emergency department is [phone].

## 2019-07-21 NOTE — ED Notes (Signed)
Pt A & O . VSS explain dc instructions . Will follow up with pcp.

## 2019-07-25 LAB — ECG 12-LEAD
P AXIS: 101 Deg
PR INTERVAL: 148 ms
QRS INTERVAL/DURATION: 101 ms
QT: 362 ms
QTc (Bazett): 404 ms
R AXIS: -22 Deg
R-R INTERVAL AVERAGE: 701 ms
T AXIS: 52 Deg
VENTRICULAR RATE: 85 {beats}/min
